# Patient Record
Sex: Female | Born: 1960 | Race: White | Hispanic: No | Marital: Married | Smoking: Never smoker
Health system: Southern US, Community
[De-identification: ages and names within clinical notes are randomized; demographics above are authoritative.]

## PROBLEM LIST (undated history)

## (undated) DIAGNOSIS — F909 Attention-deficit hyperactivity disorder, unspecified type: Secondary | ICD-10-CM

## (undated) DIAGNOSIS — I10 Essential (primary) hypertension: Secondary | ICD-10-CM

## (undated) HISTORY — PX: ABDOMINAL HYSTERECTOMY: SHX81

## (undated) HISTORY — DX: Attention-deficit hyperactivity disorder, unspecified type: F90.9

## (undated) HISTORY — PX: WISDOM TOOTH EXTRACTION: SHX21

## (undated) HISTORY — DX: Essential (primary) hypertension: I10

---

## 2009-04-13 ENCOUNTER — Encounter: Admission: RE | Admit: 2009-04-13 | Discharge: 2009-04-13 | Payer: Self-pay | Admitting: Obstetrics and Gynecology

## 2010-08-06 ENCOUNTER — Ambulatory Visit: Payer: Self-pay | Admitting: Oncology

## 2010-08-19 ENCOUNTER — Ambulatory Visit: Payer: Self-pay | Admitting: Genetic Counselor

## 2010-09-19 ENCOUNTER — Ambulatory Visit: Payer: Self-pay | Admitting: Oncology

## 2010-12-09 ENCOUNTER — Encounter: Payer: Self-pay | Admitting: Obstetrics and Gynecology

## 2011-05-20 ENCOUNTER — Other Ambulatory Visit: Payer: Self-pay | Admitting: Obstetrics and Gynecology

## 2011-05-20 DIAGNOSIS — N6313 Unspecified lump in the right breast, lower outer quadrant: Secondary | ICD-10-CM

## 2011-05-20 DIAGNOSIS — Z8041 Family history of malignant neoplasm of ovary: Secondary | ICD-10-CM

## 2011-05-23 ENCOUNTER — Ambulatory Visit
Admission: RE | Admit: 2011-05-23 | Discharge: 2011-05-23 | Disposition: A | Discharge status: Home / Self Care | Payer: PRIVATE HEALTH INSURANCE | Source: Ambulatory Visit | Attending: Obstetrics and Gynecology | Admitting: Obstetrics and Gynecology

## 2011-05-23 ENCOUNTER — Other Ambulatory Visit: Payer: Self-pay | Admitting: Obstetrics and Gynecology

## 2011-05-23 DIAGNOSIS — Z8041 Family history of malignant neoplasm of ovary: Secondary | ICD-10-CM

## 2011-05-23 DIAGNOSIS — N6313 Unspecified lump in the right breast, lower outer quadrant: Secondary | ICD-10-CM

## 2011-06-11 ENCOUNTER — Other Ambulatory Visit: Payer: Self-pay | Admitting: Internal Medicine

## 2011-06-11 DIAGNOSIS — R1011 Right upper quadrant pain: Secondary | ICD-10-CM

## 2011-06-12 ENCOUNTER — Encounter: Payer: Self-pay | Admitting: Internal Medicine

## 2011-06-17 ENCOUNTER — Ambulatory Visit
Admission: RE | Admit: 2011-06-17 | Discharge: 2011-06-17 | Disposition: A | Discharge status: Home / Self Care | Payer: PRIVATE HEALTH INSURANCE | Source: Ambulatory Visit | Attending: Internal Medicine | Admitting: Internal Medicine

## 2011-06-17 DIAGNOSIS — R1011 Right upper quadrant pain: Secondary | ICD-10-CM

## 2011-06-19 ENCOUNTER — Ambulatory Visit: Payer: PRIVATE HEALTH INSURANCE | Admitting: Gastroenterology

## 2011-08-11 ENCOUNTER — Encounter: Payer: Self-pay | Admitting: Internal Medicine

## 2011-08-11 ENCOUNTER — Ambulatory Visit (INDEPENDENT_AMBULATORY_CARE_PROVIDER_SITE_OTHER): Payer: PRIVATE HEALTH INSURANCE | Admitting: Internal Medicine

## 2011-08-11 VITALS — BP 106/70 | HR 108 | Ht 66.0 in | Wt 135.0 lb

## 2011-08-11 DIAGNOSIS — R1011 Right upper quadrant pain: Secondary | ICD-10-CM

## 2011-08-11 MED ORDER — PEG-KCL-NACL-NASULF-NA ASC-C 100 G PO SOLR: 1.0000 | Freq: Once | ORAL | Status: DC

## 2011-08-11 MED ORDER — HYOSCYAMINE SULFATE 0.125 MG SL SUBL: 0.1250 mg | SUBLINGUAL_TABLET | SUBLINGUAL | Status: DC | PRN

## 2011-08-11 NOTE — Patient Instructions (Addendum)
You have been scheduled for a colonoscopy. Please follow written instructions given to you at your visit today.  Please pick up your Moviprep kit at the pharmacy within the next 2-3 days. We have sent the following medications to your pharmacy for you to pick up at your convenience: levsin SL CC: [name], FNP

## 2011-08-11 NOTE — Progress Notes (Signed)
Angela Kim 1961/06/25 MRN [id number]    History of Present Illness:  This is a 50 year old white female with intermittent right costal margin discomfort which feels like pressure. It occurs during the day and may last several minutes to several hours. It is not associated with meals or position although it never occurs at night. Her bowel habits have been regular. She had a normal upper abdominal ultrasound. Her weight has been stable. She would like to have a colonoscopy based on the guidelines for colorectal screening. The discomfort has not been progressive. There is no position that would relieve it.    Past Medical History  Diagnosis Date  . Hemorrhoids     external   Past Surgical History  Procedure Date  . Wisdom tooth extraction     reports that she has never smoked. She has never used smokeless tobacco. She reports that she drinks alcohol. She reports that she does not use illicit drugs. family history includes Hypertension in her father and Ovarian cancer in her mother. No Known Allergies      Review of Systems: Denies symptoms of reflux, dysphagia, shortness of breath or chest pain The remainder of the 10  point ROS is negative except as outlined in H&P   Physical Exam: General appearance  Well developed, in no distress. Eyes- non icteric. HEENT nontraumatic, normocephalic. Mouth no lesions, tongue papillated, no cheilosis. Neck supple without adenopathy, thyroid not enlarged, no carotid bruits, no JVD. Lungs Clear to auscultation bilaterally. Cor normal S1 normal S2, regular rhythm , no murmur,  quiet precordium. Abdomen soft nontender with normal rib cage no tenderness of the rates. No CVA tenderness. Liver at costal margin. Liver not tender. Left upper quadrant unremarkable. Lower abdomen unremarkable. Rectal: Not done. Extremities no pedal edema. Skin no lesions. Neurological alert and oriented x 3. Psychological normal mood and affect.  Assessment and  Plan:  Problem #1 Persistent intermittent sensation along the right costal margin of unknown etiology. It could be something as simple as hepatic flexure syndrome caused by pressure of the colon on the liver or  possibly on the ribs. She is not overweight but she tends to sit at a computer and that could cause some crowding affect. She is due for a screening colonoscopy and I recommend that she has it to rule out colon pathology. Her symptoms don't seem to be progressive. We have discussed the possibility of a CT scan of the abdomen and pelvis to look for a space-occupying lesion. She is satisfied with just having a colonoscopy. She will take Levsin sublingually 0.125 mg every 4 hours when necessary for abdominal pain to see if that relieves her symptoms.   08/11/2011 Lina Sar

## 2011-08-15 ENCOUNTER — Other Ambulatory Visit: Payer: Self-pay | Admitting: Internal Medicine

## 2011-09-11 ENCOUNTER — Other Ambulatory Visit: Payer: PRIVATE HEALTH INSURANCE | Admitting: Internal Medicine

## 2011-09-12 ENCOUNTER — Other Ambulatory Visit: Payer: Self-pay | Admitting: Internal Medicine

## 2011-09-29 ENCOUNTER — Ambulatory Visit (AMBULATORY_SURGERY_CENTER): Payer: PRIVATE HEALTH INSURANCE | Admitting: Internal Medicine

## 2011-09-29 ENCOUNTER — Encounter: Payer: Self-pay | Admitting: Internal Medicine

## 2011-09-29 DIAGNOSIS — Z1211 Encounter for screening for malignant neoplasm of colon: Secondary | ICD-10-CM

## 2011-09-29 DIAGNOSIS — R1011 Right upper quadrant pain: Secondary | ICD-10-CM

## 2011-09-29 MED ORDER — SODIUM CHLORIDE 0.9 % IV SOLN: 500.0000 mL | INTRAVENOUS | Status: DC

## 2011-09-29 NOTE — Progress Notes (Signed)
Dr. Juanda Chance informed of LMP=07/30/2011. No orders received . Pt felt like irreg. periods x one yr. due to premenopause. Pt still on birthcontrol pills which she takes regularly, took last yesterday.

## 2011-09-30 ENCOUNTER — Telehealth: Payer: Self-pay | Admitting: *Deleted

## 2011-09-30 NOTE — Telephone Encounter (Signed)
Number ID on answering machine. Left message to call us back if she is experiencing any problems or has any questions.

## 2013-02-16 IMAGING — MG MM DIAGNOSTIC BILATERAL
5 series · 5 of 5 positions shown · non-contrast
Comparison: Prior exams

CLINICAL DATA: Questioned palpable finding per patient right
breast nine o'clock location

DIGITAL DIAGNOSTIC BILATERAL MAMMOGRAM WITH CAD AND RIGHT BREAST
ULTRASOUND:

[R CC]
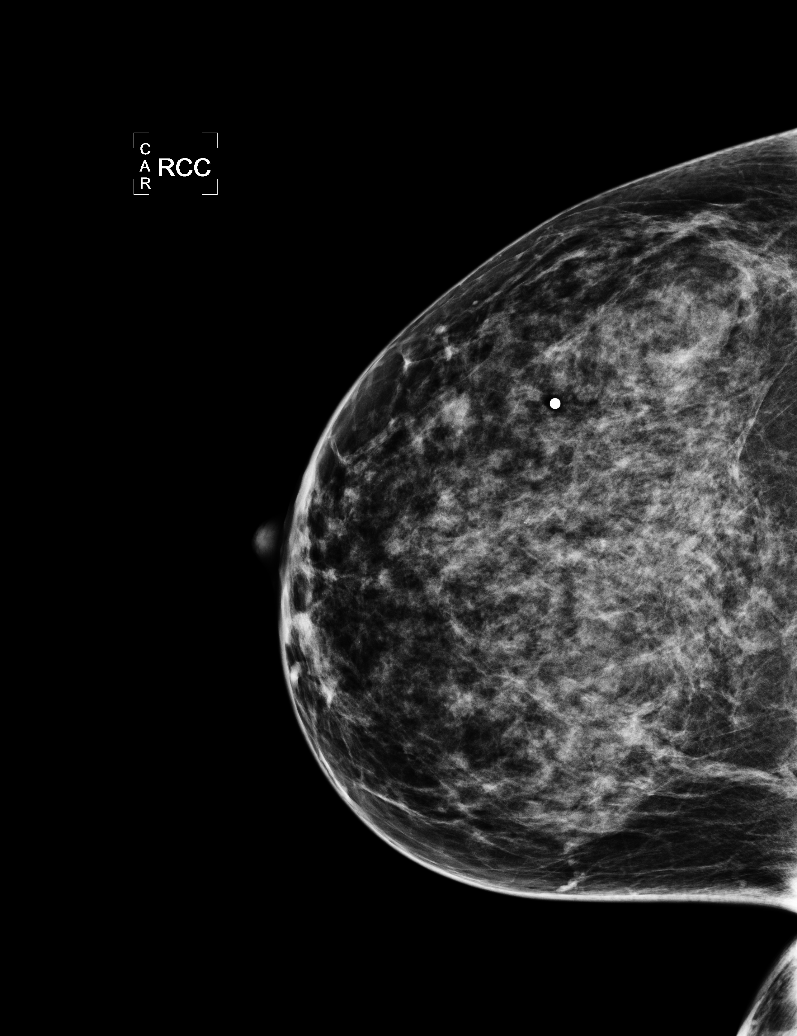

[L CC]
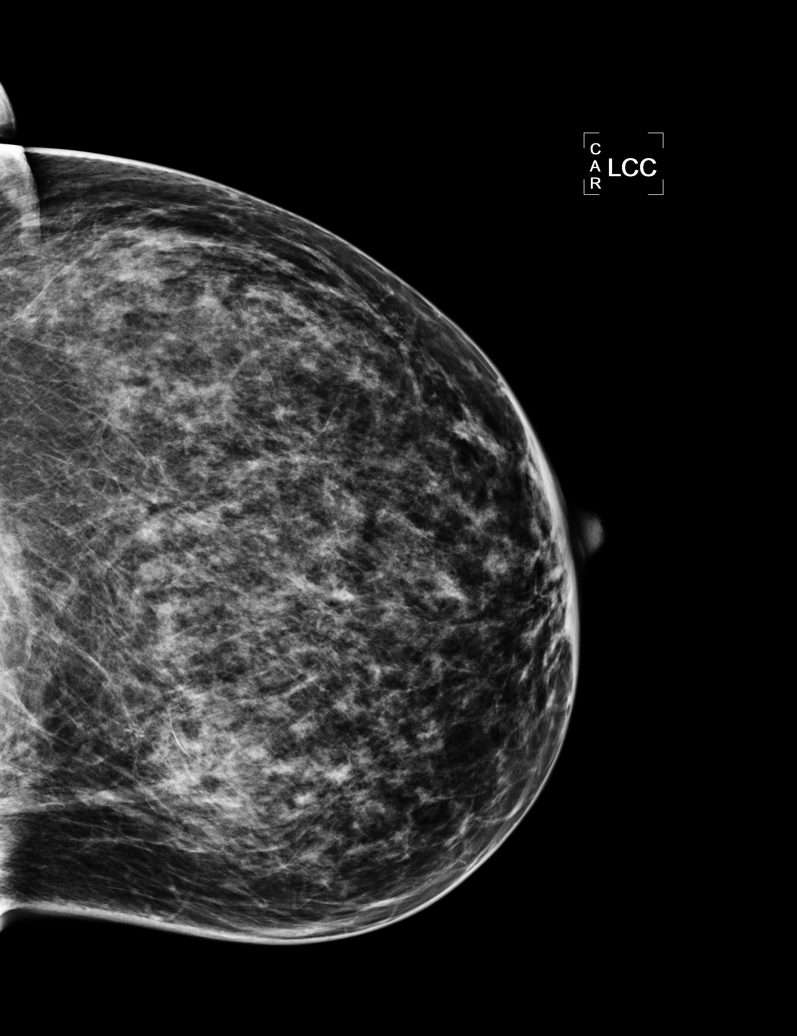

[L MLO]
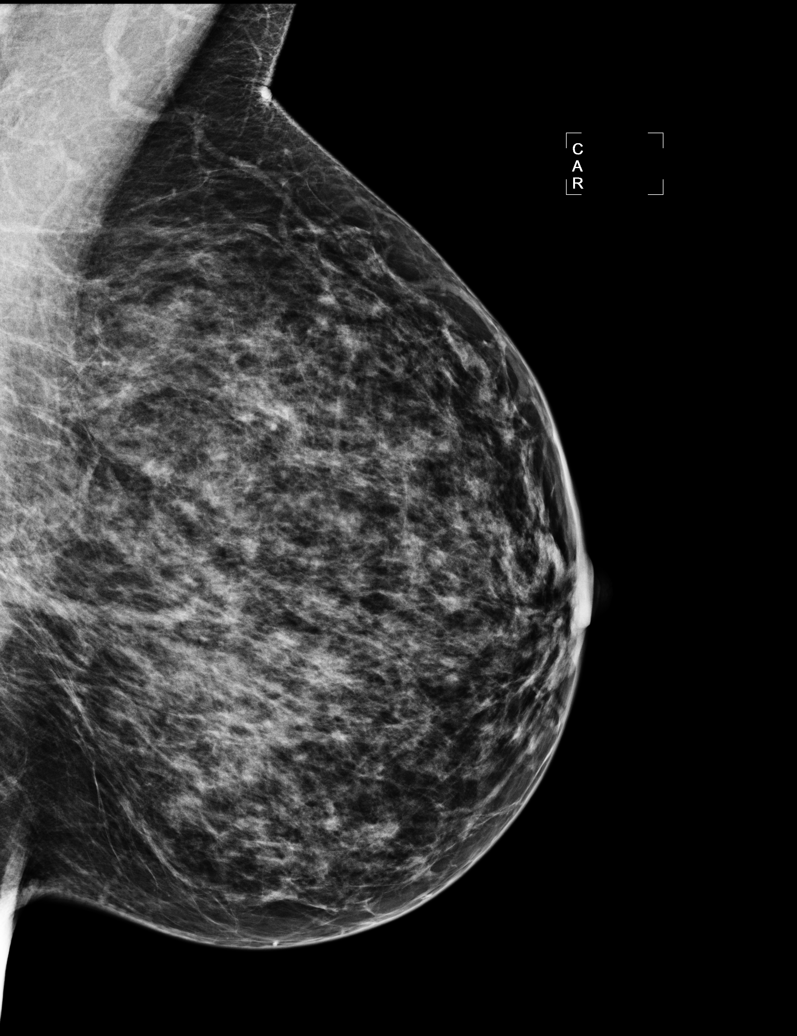

[R MLO]
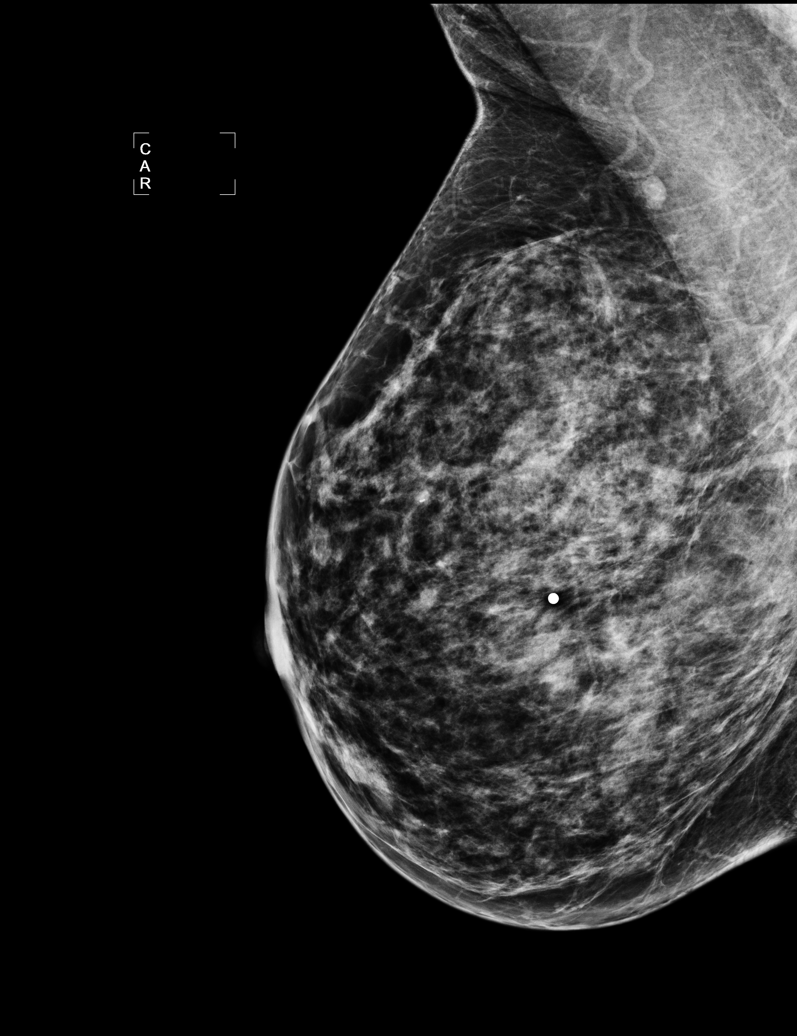

[R TAN]
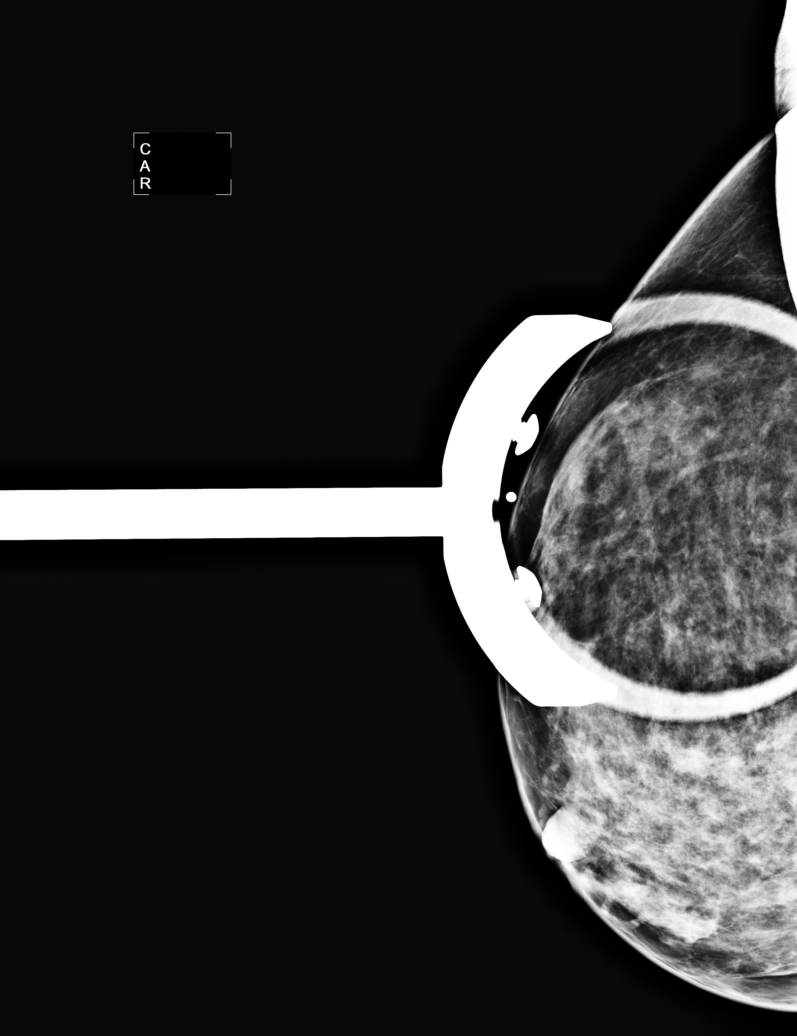

[5 of 5 positions shown; findings below may reference images not displayed]

FINDINGS: The breast parenchyma is extremely dense, which may
limit the sensitivity of mammography.  No suspicious mass,
calcification, or architectural distortion is seen on either side.
There is no change.
Mammographic images were processed with CAD.

On physical exam, I palpate dense tissue in the right breast nine
o'clock location but no focal abnormality.

Ultrasound is performed, showing normal-appearing fibroglandular
tissue in the right breast nine o'clock area at the site of the
patient indicated questioned palpable finding.
IMPRESSION: No evidence for malignancy in either breast.  Any further
management of the questioned palpable finding should be dictated by
clinical assessment.  We extensively discussed options for further
follow-up.  I emphasized the need for continued breast self exams
to assess for lack of resolution or any other change.  Ultimately,
surgical excision may be necessary if the patient desires to
definitively exclude malignancy.  We decided to pursue 6-month
follow-up right diagnostic mammography unless there is any clinical
change, in which case repeat mammography/ultrasound could be
performed, or MRI may be an option for further evaluation.

BI-RADS CATEGORY 3:  Probably benign finding(s) - short interval
follow-up suggested.

Recommendation:  Right diagnostic mammogram and possibly ultrasound
in 6 months.

## 2014-07-15 ENCOUNTER — Emergency Department (HOSPITAL_COMMUNITY)
Admission: EM | Admit: 2014-07-15 | Discharge: 2014-07-15 | Disposition: A | Discharge status: Home / Self Care | Payer: BC Managed Care – PPO | Attending: Emergency Medicine | Admitting: Emergency Medicine

## 2014-07-15 ENCOUNTER — Encounter (HOSPITAL_COMMUNITY): Payer: Self-pay | Admitting: Emergency Medicine

## 2014-07-15 DIAGNOSIS — M545 Low back pain, unspecified: Secondary | ICD-10-CM | POA: Diagnosis present

## 2014-07-15 DIAGNOSIS — Z79899 Other long term (current) drug therapy: Secondary | ICD-10-CM | POA: Insufficient documentation

## 2014-07-15 DIAGNOSIS — Z87828 Personal history of other (healed) physical injury and trauma: Secondary | ICD-10-CM | POA: Insufficient documentation

## 2014-07-15 LAB — URINALYSIS, ROUTINE W REFLEX MICROSCOPIC
BILIRUBIN URINE: NEGATIVE
Glucose, UA: NEGATIVE mg/dL
Hgb urine dipstick: NEGATIVE
Ketones, ur: NEGATIVE mg/dL
Leukocytes, UA: NEGATIVE
NITRITE: NEGATIVE
PROTEIN: NEGATIVE mg/dL
SPECIFIC GRAVITY, URINE: 1.029 (ref 1.005–1.030)
UROBILINOGEN UA: 1 mg/dL (ref 0.0–1.0)
pH: 7 (ref 5.0–8.0)

## 2014-07-15 MED ORDER — HYDROCODONE-ACETAMINOPHEN 5-325 MG PO TABS
2.0000 | ORAL_TABLET | Freq: Once | ORAL | Status: AC
  Administered 2014-07-15: 2 via ORAL
  Filled 2014-07-15: qty 2

## 2014-07-15 MED ORDER — HYDROCODONE-ACETAMINOPHEN 5-325 MG PO TABS: 1.0000 | ORAL_TABLET | Freq: Four times a day (QID) | ORAL | Status: DC | PRN

## 2014-07-15 NOTE — Discharge Instructions (Signed)
Take Tylenol or Advil for mild pain or the pain medicine prescribed for bad pain. Get your blood pressure recheck within the next 3 weeks. Today's is mildly elevated at 150/93. Call Dr. Carolee Rota office to arrange to be seen in a week if not feeling better

## 2014-07-15 NOTE — ED Notes (Signed)
Pt from home c/o R side and mid back pain. Pt reports the flank pain has been for 3years and mid back pain has been for 3 weeks. Pt has been taking ibuprofen w/o relief. Pt denies urinary s/sx. Pt denies injury, but reports that she helped her boys move to college and was lifting heavy items. Pt is A&O and in NAD

## 2014-07-15 NOTE — ED Notes (Signed)
Spoke with MD Jacubowitz-patient requests ultrasound or CT scan. MD states "I don't see the need for any further treatment on her. I've already spoken with her." Family aware.

## 2014-07-15 NOTE — ED Provider Notes (Addendum)
CSN: [id number]     Arrival date & time 07/15/14  1040 History   First MD Initiated Contact with Patient 07/15/14 1123     Chief Complaint  Patient presents with  . Back Pain  . Flank Pain     (Consider location/radiation/quality/duration/timing/severity/associated sxs/prior Treatment) HPI Presents with right-sided low back pain, parathoracic area, paralumbar area onset 1 month ago. Pain is worse with certain positions improved with other positions. No shortness of breath. Pain is not affected by ED in. No urinary symptoms no fever . She is treated herself with ibuprofen without relief. No rash. Pain is sharp, progressively worsening. Worsen she helped her son's move several days ago to Past Medical History  Diagnosis Date  . Hemorrhoids     external   Past Surgical History  Procedure Laterality Date  . Wisdom tooth extraction    . Abdominal hysterectomy     Family History  Problem Relation Age of Onset  . Ovarian cancer Mother   . Hypertension Father    History  Substance Use Topics  . Smoking status: Never Smoker   . Smokeless tobacco: Never Used  . Alcohol Use: 1.2 oz/week    2 Glasses of wine per week     Comment: socially 1-2 a week   OB History   Grav Para Term Preterm Abortions TAB SAB Ect Mult Living                 Review of Systems  Musculoskeletal: Positive for back pain.  All other systems reviewed and are negative.     Allergies  Review of patient's allergies indicates no known allergies.  Home Medications   Prior to Admission medications   Medication Sig Start Date End Date Taking? Authorizing Provider  AMBULATORY NON FORMULARY MEDICATION Antibiotic for acne     Historical Provider, MD  amphetamine-dextroamphetamine (ADDERALL) 30 MG tablet Take 30 mg by mouth daily.      Historical Provider, MD  hyoscyamine (LEVSIN/SL) 0.125 MG SL tablet Place 1 tablet (0.125 mg total) under the tongue every 4 (four) hours as needed for cramping. 08/11/11 08/21/11   Hart Carwin, MD  MINIVELLE 0.075 MG/24HR  07/04/14   Historical Provider, MD  norethindrone-ethinyl estradiol (JUNEL FE,GILDESS FE,LOESTRIN FE) 1-20 MG-MCG tablet Take 1 tablet by mouth daily.      Historical Provider, MD   BP 144/95  Pulse 104  Temp(Src) 98.1 F (36.7 C)  Resp 16  SpO2 96%  LMP 07/28/2011 Physical Exam  Nursing note and vitals reviewed. Constitutional: She appears well-developed and well-nourished.  HENT:  Head: Normocephalic and atraumatic.  Eyes: Conjunctivae are normal. Pupils are equal, round, and reactive to light.  Neck: Neck supple. No tracheal deviation present. No thyromegaly present.  Cardiovascular: Normal rate and regular rhythm.   No murmur heard. Pulmonary/Chest: Effort normal and breath sounds normal.  Abdominal: Soft. Bowel sounds are normal. She exhibits no distension. There is no tenderness.  Musculoskeletal: Normal range of motion. She exhibits no edema and no tenderness.  Entire spine nontender. Right-sided peritonsillar thoracic and paralumbar tenderness  Neurological: She is alert. Coordination normal.  Gait normal  Skin: Skin is warm and dry. No rash noted.  Psychiatric: She has a normal mood and affect.    ED Course  Procedures (including critical care time) Labs Review Labs Reviewed - No data to display  Imaging Review No results found.   EKG Interpretation None      Results for orders placed during the hospital encounter  of 07/15/14  URINALYSIS, ROUTINE W REFLEX MICROSCOPIC      Result Value Ref Range   Color, Urine YELLOW  YELLOW   APPearance CLOUDY (*) CLEAR   Specific Gravity, Urine 1.029  1.005 - 1.030   pH 7.0  5.0 - 8.0   Glucose, UA NEGATIVE  NEGATIVE mg/dL   Hgb urine dipstick NEGATIVE  NEGATIVE   Bilirubin Urine NEGATIVE  NEGATIVE   Ketones, ur NEGATIVE  NEGATIVE mg/dL   Protein, ur NEGATIVE  NEGATIVE mg/dL   Urobilinogen, UA 1.0  0.0 - 1.0 mg/dL   Nitrite NEGATIVE  NEGATIVE   Leukocytes, UA NEGATIVE   NEGATIVE   No results found.  1 PM pain somewhat improved after treatment with Norco. MDM  I feel the pain is most likely musculoskeletal in etiology. I don't feel acute imaging is needed. I strongly doubt gallbladder disease pain is not at all related to food. She has no abdominal tenderness. Plan prescription Norco. Blood pressure recheck within 3 weeks. Follow up with Dr.Nobles's office if not improved in a week Final diagnoses:  None   diagnosis #1 back pain #2 elevated blood pressure      Doug Sou, MD 07/15/14 1309  Doug Sou, MD 07/15/14 1310

## 2017-03-09 ENCOUNTER — Other Ambulatory Visit: Payer: Self-pay | Admitting: Obstetrics and Gynecology

## 2017-03-09 DIAGNOSIS — R928 Other abnormal and inconclusive findings on diagnostic imaging of breast: Secondary | ICD-10-CM

## 2017-03-10 ENCOUNTER — Ambulatory Visit
Admission: RE | Admit: 2017-03-10 | Discharge: 2017-03-10 | Disposition: A | Discharge status: Home / Self Care | Payer: Managed Care, Other (non HMO) | Source: Ambulatory Visit | Attending: Obstetrics and Gynecology | Admitting: Obstetrics and Gynecology

## 2017-03-10 DIAGNOSIS — R928 Other abnormal and inconclusive findings on diagnostic imaging of breast: Secondary | ICD-10-CM

## 2017-03-11 ENCOUNTER — Other Ambulatory Visit: Payer: PRIVATE HEALTH INSURANCE

## 2017-07-02 ENCOUNTER — Telehealth: Payer: Self-pay | Admitting: Cardiology

## 2017-07-02 NOTE — Telephone Encounter (Signed)
[facility] on 07/28/17 with Dr Herbie BaltimoreHarding.  Records put with Dr Elissa HeftyHarding's schedule for 07/28/17. lp

## 2017-07-28 ENCOUNTER — Encounter: Payer: Self-pay | Admitting: Cardiology

## 2017-07-28 ENCOUNTER — Ambulatory Visit (INDEPENDENT_AMBULATORY_CARE_PROVIDER_SITE_OTHER): Payer: Managed Care, Other (non HMO) | Admitting: Cardiology

## 2017-07-28 DIAGNOSIS — R9431 Abnormal electrocardiogram [ECG] [EKG]: Secondary | ICD-10-CM | POA: Diagnosis not present

## 2017-07-28 NOTE — Patient Instructions (Addendum)
NO CHANGE WITH CURRENT TREATMENT.    Your physician recommends that you schedule a follow-up appointment ON AN AS NEEDED BASIS.  

## 2017-07-28 NOTE — Progress Notes (Signed)
PCP: Merri Brunette, MD  Clinic Note: Chief Complaint  Patient presents with  . New Patient (Initial Visit)    abnormal EKG    HPI: Angela Kim is a 56 y.o. female who is being seen today for the evaluation of Abnormal EKG at the request of Merri Brunette, MD & associates.  Angela Kim was last seen on August 14 by Dr. Carolee Rota PAs (she is Dr. Carolee Rota Sister-in-law).  She was actually doing very well with no major complaints. He cut she noted some occasional lightheadedness and dizziness as well as some occasional fluttering in her chest, she had an EKG checked. EKG was read as having poor a progression probable non-specific. As result she's now referred for cardiology evaluation based on these EKG.  Recent Hospitalizations: none  Studies Personally Reviewed - (if available, images/films reviewed: From Epic Chart or Care Everywhere)  PCP EKG: 11/14/2014: Sinus rhythm, rate 79 BPM. Poor R-wave progression.Likely normal variant  EKG from August 14: Sinus rhythm, rate 87 BPM. Again poor R-wave progression noted. Nonspecific ST and T-wave changes. - Has appearance of abnormal lead placement.  Interval History: Angela Kim presents today a little bit unsure as to why she is here. She does note occasional fluttering in her chest but not associated with dizziness or syncope/near syncope. She is under quite a bit of social stress --  planning her son's wedding.  ith the stress she occasionally feels some tightness in her chest every now and then but is not associated with any particular activity She is really describes as more of a choking sensation.  She does note occasional lightheadedness and dizziness and has been told to reduce her blood pressure half. Since doing so that's been better.  She was actually put on a diuretic as her blood pressure control medicine because of occasional leg swelling.   No chest pain or shortness of breath with rest or exertion.  No PND, orthopnea with well  controlled edema. Occasional "chest fluttering" - spells last only a few seconds.   No TIA/amaurosis fugax symptoms.  No claudication.  ROS: A comprehensive was performed. Review of Systems  Constitutional: Negative for malaise/fatigue.  HENT: Negative for nosebleeds.   Respiratory: Negative for cough, shortness of breath and wheezing.   Cardiovascular:       Per history of present illness  Gastrointestinal: Negative for abdominal pain, blood in stool, heartburn and melena.  Genitourinary: Negative for hematuria.  Musculoskeletal: Negative.   Neurological: Negative.   Endo/Heme/Allergies: Negative.   Psychiatric/Behavioral:       She does have social stress. But no anxiety or depression symptoms  All other systems reviewed and are negative.  I have reviewed and (if needed) personally updated the patient's problem list, medications, allergies, past medical and surgical history, social and family history.   Past Medical History:  Diagnosis Date  . ADHD   . Essential hypertension   . Hemorrhoids    external    Past Surgical History:  Procedure Laterality Date  . ABDOMINAL HYSTERECTOMY    . WISDOM TOOTH EXTRACTION      Current Meds  Medication Sig  . lisdexamfetamine (VYVANSE) 70 MG capsule Take 1 tablet by mouth daily.  Marland Kitchen MINIVELLE 0.075 MG/24HR Place 1 patch onto the skin 2 (two) times a week.   . triamterene-hydrochlorothiazide (DYAZIDE) 37.5-25 MG capsule Take by mouth daily. Takes 1/2 tablet  . zolpidem (AMBIEN) 5 MG tablet Take 5 mg by mouth at bedtime as needed for sleep.    No  Known Allergies  Social History   Social History  . Marital status: Married    Spouse name: N/A  . Number of children: 4  . Years of education: N/A   Occupational History  . authur/ speaker    Social History Main Topics  . Smoking status: Never Smoker  . Smokeless tobacco: Never Used  . Alcohol use 1.2 oz/week    2 Glasses of wine per week     Comment: socially 1-2 a week  .  Drug use: No  . Sexual activity: Yes   Other Topics Concern  . None   Social History Narrative   She indicates that she does not actually exercising regularly, but does stay active.    family history includes Atrial fibrillation in her father; Ovarian cancer in her mother.  Wt Readings from Last 3 Encounters:  07/28/17 151 lb 3.2 oz (68.6 kg)  09/29/11 135 lb (61.2 kg)  08/11/11 135 lb (61.2 kg)    PHYSICAL EXAM BP 140/86   Pulse (!) 105   Ht 5\' 6"  (1.676 m)   Wt 151 lb 3.2 oz (68.6 kg)   LMP 07/28/2011   BMI 24.40 kg/m  Physical Exam  Constitutional: She is oriented to person, place, and time. She appears well-developed and well-nourished. No distress.  Healthy-appearing. Well-groomed  HENT:  Head: Normocephalic and atraumatic.  Eyes: Pupils are equal, round, and reactive to light. EOM are normal. No scleral icterus.  Neck: Normal range of motion. Neck supple. No hepatojugular reflux and no JVD present. Carotid bruit is not present.  Cardiovascular: Normal rate, regular rhythm, normal heart sounds and intact distal pulses.   No extrasystoles are present. PMI is not displaced.  Exam reveals no gallop and no friction rub.   No murmur heard. Pulmonary/Chest: Effort normal and breath sounds normal. No respiratory distress. She has no wheezes. She has no rales.  Abdominal: Soft. Bowel sounds are normal. She exhibits no distension. There is no tenderness. There is no rebound and no guarding.  Musculoskeletal: Normal range of motion. She exhibits no edema or deformity.  Lymphadenopathy:    She has no cervical adenopathy.  Neurological: She is alert and oriented to person, place, and time. No cranial nerve deficit.  Skin: Skin is warm and dry. No rash noted. No erythema.  Psychiatric: She has a normal mood and affect. Her behavior is normal. Thought content normal.     Adult ECG Report  Rate: 105 ;  Rhythm: sinus tachycardia and ~ left atrial abnormality. Otherwise normal axis,  intervals and durations.;   Narrative Interpretation: relatively normal EKG.  Other studies Reviewed: Additional studies/ records that were reviewed today include:  Recent Labs:  n/a   ASSESSMENT / PLAN: Problem List Items Addressed This Visit    Abnormal EKG    I reviewed the EKGs from her PCPs office as well as her EKG here today. She has very mild nonspecific changes. Poor r-wave progression is a very nonspecific finding,  and in the absence of any other risk factors would probably not be something that would further evaluate.  He has no angina type symptoms, no heart failure type symptoms and therefore, I don't think evaluation with either a stress test or echocardiogram at this time would be warranted.  Worsening to have symptoms concerning for heart failure, angina or significant palpitations, would then consider echocardiogram plus or minus monitor or GXT.      Relevant Orders   EKG 12-Lead      Current  medicines are reviewed at length with the patient today. (+/- concerns) n/a The following changes have been made: n/a  Patient Instructions  NO CHANGE  WITH CURRENT TREATMENT   Your physician recommends that you schedule a follow-up appointment ON AN AS NEEDED BASIS.    Marland Kitchen   Studies Ordered:   Orders Placed This Encounter  Procedures  . EKG 12-Lead      Bryan Lemma, M.D., M.S. Interventional Cardiologist   Pager # [phone] Phone # [phone] [address] [place], [place] [address]

## 2017-08-04 ENCOUNTER — Encounter: Payer: Self-pay | Admitting: Cardiology

## 2017-08-04 DIAGNOSIS — R9431 Abnormal electrocardiogram [ECG] [EKG]: Secondary | ICD-10-CM | POA: Insufficient documentation

## 2017-08-04 NOTE — Assessment & Plan Note (Signed)
I reviewed the EKGs from her PCPs office as well as her EKG here today. She has very mild nonspecific changes. Poor r-wave progression is a very nonspecific finding,  and in the absence of any other risk factors would probably not be something that would further evaluate.  He has no angina type symptoms, no heart failure type symptoms and therefore, I don't think evaluation with either a stress test or echocardiogram at this time would be warranted.  Worsening to have symptoms concerning for heart failure, angina or significant palpitations, would then consider echocardiogram plus or minus monitor or GXT.

## 2019-02-01 DIAGNOSIS — F900 Attention-deficit hyperactivity disorder, predominantly inattentive type: Secondary | ICD-10-CM | POA: Diagnosis not present

## 2019-05-03 DIAGNOSIS — F900 Attention-deficit hyperactivity disorder, predominantly inattentive type: Secondary | ICD-10-CM | POA: Diagnosis not present

## 2019-05-17 DIAGNOSIS — Z6826 Body mass index (BMI) 26.0-26.9, adult: Secondary | ICD-10-CM | POA: Diagnosis not present

## 2019-05-17 DIAGNOSIS — Z01419 Encounter for gynecological examination (general) (routine) without abnormal findings: Secondary | ICD-10-CM | POA: Diagnosis not present

## 2019-05-17 DIAGNOSIS — Z1231 Encounter for screening mammogram for malignant neoplasm of breast: Secondary | ICD-10-CM | POA: Diagnosis not present

## 2019-07-14 DIAGNOSIS — F900 Attention-deficit hyperactivity disorder, predominantly inattentive type: Secondary | ICD-10-CM | POA: Diagnosis not present

## 2019-08-09 DIAGNOSIS — E559 Vitamin D deficiency, unspecified: Secondary | ICD-10-CM | POA: Diagnosis not present

## 2019-08-09 DIAGNOSIS — I10 Essential (primary) hypertension: Secondary | ICD-10-CM | POA: Diagnosis not present

## 2019-08-09 DIAGNOSIS — E7800 Pure hypercholesterolemia, unspecified: Secondary | ICD-10-CM | POA: Diagnosis not present

## 2019-08-09 DIAGNOSIS — E538 Deficiency of other specified B group vitamins: Secondary | ICD-10-CM | POA: Diagnosis not present

## 2019-08-16 DIAGNOSIS — Z0000 Encounter for general adult medical examination without abnormal findings: Secondary | ICD-10-CM | POA: Diagnosis not present

## 2019-08-16 DIAGNOSIS — Z23 Encounter for immunization: Secondary | ICD-10-CM | POA: Diagnosis not present

## 2019-08-29 DIAGNOSIS — Z20828 Contact with and (suspected) exposure to other viral communicable diseases: Secondary | ICD-10-CM | POA: Diagnosis not present

## 2019-10-05 DIAGNOSIS — F900 Attention-deficit hyperactivity disorder, predominantly inattentive type: Secondary | ICD-10-CM | POA: Diagnosis not present

## 2019-10-31 DIAGNOSIS — H903 Sensorineural hearing loss, bilateral: Secondary | ICD-10-CM | POA: Diagnosis not present

## 2019-11-23 DIAGNOSIS — Z20828 Contact with and (suspected) exposure to other viral communicable diseases: Secondary | ICD-10-CM | POA: Diagnosis not present

## 2020-01-03 DIAGNOSIS — F900 Attention-deficit hyperactivity disorder, predominantly inattentive type: Secondary | ICD-10-CM | POA: Diagnosis not present

## 2020-03-16 DIAGNOSIS — M25561 Pain in right knee: Secondary | ICD-10-CM | POA: Diagnosis not present

## 2020-03-29 DIAGNOSIS — M25561 Pain in right knee: Secondary | ICD-10-CM | POA: Diagnosis not present

## 2020-03-29 DIAGNOSIS — M238X1 Other internal derangements of right knee: Secondary | ICD-10-CM | POA: Diagnosis not present

## 2020-04-10 DIAGNOSIS — F900 Attention-deficit hyperactivity disorder, predominantly inattentive type: Secondary | ICD-10-CM | POA: Diagnosis not present

## 2020-05-24 DIAGNOSIS — Z01419 Encounter for gynecological examination (general) (routine) without abnormal findings: Secondary | ICD-10-CM | POA: Diagnosis not present

## 2020-05-24 DIAGNOSIS — Z6826 Body mass index (BMI) 26.0-26.9, adult: Secondary | ICD-10-CM | POA: Diagnosis not present

## 2020-05-24 DIAGNOSIS — Z1231 Encounter for screening mammogram for malignant neoplasm of breast: Secondary | ICD-10-CM | POA: Diagnosis not present

## 2020-07-02 DIAGNOSIS — Z20828 Contact with and (suspected) exposure to other viral communicable diseases: Secondary | ICD-10-CM | POA: Diagnosis not present

## 2020-07-10 DIAGNOSIS — F900 Attention-deficit hyperactivity disorder, predominantly inattentive type: Secondary | ICD-10-CM | POA: Diagnosis not present

## 2020-08-21 DIAGNOSIS — Z0000 Encounter for general adult medical examination without abnormal findings: Secondary | ICD-10-CM | POA: Diagnosis not present

## 2020-08-21 DIAGNOSIS — M1990 Unspecified osteoarthritis, unspecified site: Secondary | ICD-10-CM | POA: Diagnosis not present

## 2020-08-21 DIAGNOSIS — E559 Vitamin D deficiency, unspecified: Secondary | ICD-10-CM | POA: Diagnosis not present

## 2020-08-21 DIAGNOSIS — E538 Deficiency of other specified B group vitamins: Secondary | ICD-10-CM | POA: Diagnosis not present

## 2020-08-21 DIAGNOSIS — I10 Essential (primary) hypertension: Secondary | ICD-10-CM | POA: Diagnosis not present

## 2020-08-28 DIAGNOSIS — Z0000 Encounter for general adult medical examination without abnormal findings: Secondary | ICD-10-CM | POA: Diagnosis not present

## 2020-08-28 DIAGNOSIS — Z23 Encounter for immunization: Secondary | ICD-10-CM | POA: Diagnosis not present

## 2020-10-09 DIAGNOSIS — F900 Attention-deficit hyperactivity disorder, predominantly inattentive type: Secondary | ICD-10-CM | POA: Diagnosis not present

## 2020-11-11 DIAGNOSIS — Z20828 Contact with and (suspected) exposure to other viral communicable diseases: Secondary | ICD-10-CM | POA: Diagnosis not present

## 2021-01-08 DIAGNOSIS — F900 Attention-deficit hyperactivity disorder, predominantly inattentive type: Secondary | ICD-10-CM | POA: Diagnosis not present

## 2021-01-24 DIAGNOSIS — Z20822 Contact with and (suspected) exposure to covid-19: Secondary | ICD-10-CM | POA: Diagnosis not present

## 2021-01-25 DIAGNOSIS — Z1159 Encounter for screening for other viral diseases: Secondary | ICD-10-CM | POA: Diagnosis not present

## 2021-03-20 DIAGNOSIS — F900 Attention-deficit hyperactivity disorder, predominantly inattentive type: Secondary | ICD-10-CM | POA: Diagnosis not present

## 2021-06-10 DIAGNOSIS — Z1231 Encounter for screening mammogram for malignant neoplasm of breast: Secondary | ICD-10-CM | POA: Diagnosis not present

## 2021-06-10 DIAGNOSIS — Z01419 Encounter for gynecological examination (general) (routine) without abnormal findings: Secondary | ICD-10-CM | POA: Diagnosis not present

## 2021-06-10 DIAGNOSIS — Z6826 Body mass index (BMI) 26.0-26.9, adult: Secondary | ICD-10-CM | POA: Diagnosis not present

## 2021-06-19 DIAGNOSIS — F900 Attention-deficit hyperactivity disorder, predominantly inattentive type: Secondary | ICD-10-CM | POA: Diagnosis not present

## 2021-09-18 DIAGNOSIS — F900 Attention-deficit hyperactivity disorder, predominantly inattentive type: Secondary | ICD-10-CM | POA: Diagnosis not present

## 2021-11-30 ENCOUNTER — Encounter: Payer: Self-pay | Admitting: Gastroenterology

## 2022-01-09 DIAGNOSIS — F900 Attention-deficit hyperactivity disorder, predominantly inattentive type: Secondary | ICD-10-CM | POA: Diagnosis not present

## 2022-04-10 DIAGNOSIS — F900 Attention-deficit hyperactivity disorder, predominantly inattentive type: Secondary | ICD-10-CM | POA: Diagnosis not present

## 2022-07-10 DIAGNOSIS — F900 Attention-deficit hyperactivity disorder, predominantly inattentive type: Secondary | ICD-10-CM | POA: Diagnosis not present

## 2022-08-11 DIAGNOSIS — E538 Deficiency of other specified B group vitamins: Secondary | ICD-10-CM | POA: Diagnosis not present

## 2022-08-11 DIAGNOSIS — E559 Vitamin D deficiency, unspecified: Secondary | ICD-10-CM | POA: Diagnosis not present

## 2022-08-11 DIAGNOSIS — E8941 Symptomatic postprocedural ovarian failure: Secondary | ICD-10-CM | POA: Diagnosis not present

## 2022-08-11 DIAGNOSIS — R5383 Other fatigue: Secondary | ICD-10-CM | POA: Diagnosis not present

## 2022-08-11 DIAGNOSIS — Z1329 Encounter for screening for other suspected endocrine disorder: Secondary | ICD-10-CM | POA: Diagnosis not present

## 2022-08-11 DIAGNOSIS — I10 Essential (primary) hypertension: Secondary | ICD-10-CM | POA: Diagnosis not present

## 2022-08-11 DIAGNOSIS — Z131 Encounter for screening for diabetes mellitus: Secondary | ICD-10-CM | POA: Diagnosis not present

## 2022-09-01 DIAGNOSIS — Z1231 Encounter for screening mammogram for malignant neoplasm of breast: Secondary | ICD-10-CM | POA: Diagnosis not present

## 2022-09-01 DIAGNOSIS — Z01419 Encounter for gynecological examination (general) (routine) without abnormal findings: Secondary | ICD-10-CM | POA: Diagnosis not present

## 2022-09-01 DIAGNOSIS — Z6827 Body mass index (BMI) 27.0-27.9, adult: Secondary | ICD-10-CM | POA: Diagnosis not present

## 2022-09-08 DIAGNOSIS — N951 Menopausal and female climacteric states: Secondary | ICD-10-CM | POA: Diagnosis not present

## 2022-09-08 DIAGNOSIS — R5383 Other fatigue: Secondary | ICD-10-CM | POA: Diagnosis not present

## 2022-10-01 DIAGNOSIS — F900 Attention-deficit hyperactivity disorder, predominantly inattentive type: Secondary | ICD-10-CM | POA: Diagnosis not present

## 2022-12-24 DIAGNOSIS — F900 Attention-deficit hyperactivity disorder, predominantly inattentive type: Secondary | ICD-10-CM | POA: Diagnosis not present

## 2023-01-27 DIAGNOSIS — R5383 Other fatigue: Secondary | ICD-10-CM | POA: Diagnosis not present

## 2023-01-27 DIAGNOSIS — N951 Menopausal and female climacteric states: Secondary | ICD-10-CM | POA: Diagnosis not present

## 2023-01-27 DIAGNOSIS — R7982 Elevated C-reactive protein (CRP): Secondary | ICD-10-CM | POA: Diagnosis not present

## 2023-01-27 DIAGNOSIS — E559 Vitamin D deficiency, unspecified: Secondary | ICD-10-CM | POA: Diagnosis not present

## 2023-01-27 DIAGNOSIS — E8941 Symptomatic postprocedural ovarian failure: Secondary | ICD-10-CM | POA: Diagnosis not present

## 2023-01-27 DIAGNOSIS — Z1329 Encounter for screening for other suspected endocrine disorder: Secondary | ICD-10-CM | POA: Diagnosis not present

## 2023-02-10 DIAGNOSIS — I10 Essential (primary) hypertension: Secondary | ICD-10-CM | POA: Diagnosis not present

## 2023-02-10 DIAGNOSIS — N951 Menopausal and female climacteric states: Secondary | ICD-10-CM | POA: Diagnosis not present

## 2023-02-10 DIAGNOSIS — R7982 Elevated C-reactive protein (CRP): Secondary | ICD-10-CM | POA: Diagnosis not present

## 2023-02-10 DIAGNOSIS — R5383 Other fatigue: Secondary | ICD-10-CM | POA: Diagnosis not present

## 2023-03-12 DIAGNOSIS — F900 Attention-deficit hyperactivity disorder, predominantly inattentive type: Secondary | ICD-10-CM | POA: Diagnosis not present

## 2023-06-11 DIAGNOSIS — F900 Attention-deficit hyperactivity disorder, predominantly inattentive type: Secondary | ICD-10-CM | POA: Diagnosis not present

## 2023-07-21 DIAGNOSIS — L710 Perioral dermatitis: Secondary | ICD-10-CM | POA: Diagnosis not present

## 2023-07-21 DIAGNOSIS — R5381 Other malaise: Secondary | ICD-10-CM | POA: Diagnosis not present

## 2023-07-21 DIAGNOSIS — R7982 Elevated C-reactive protein (CRP): Secondary | ICD-10-CM | POA: Diagnosis not present

## 2023-07-21 DIAGNOSIS — R5383 Other fatigue: Secondary | ICD-10-CM | POA: Diagnosis not present

## 2023-07-22 ENCOUNTER — Other Ambulatory Visit (HOSPITAL_COMMUNITY): Payer: Self-pay | Admitting: Registered Nurse

## 2023-07-22 DIAGNOSIS — R7982 Elevated C-reactive protein (CRP): Secondary | ICD-10-CM

## 2023-07-30 ENCOUNTER — Ambulatory Visit (HOSPITAL_COMMUNITY)
Admission: RE | Admit: 2023-07-30 | Discharge: 2023-07-30 | Disposition: A | Discharge status: Home / Self Care | Payer: Self-pay | Source: Ambulatory Visit | Attending: Registered Nurse | Admitting: Registered Nurse

## 2023-07-30 DIAGNOSIS — R7982 Elevated C-reactive protein (CRP): Secondary | ICD-10-CM | POA: Insufficient documentation

## 2023-07-31 ENCOUNTER — Other Ambulatory Visit: Payer: Self-pay

## 2023-07-31 ENCOUNTER — Emergency Department (HOSPITAL_COMMUNITY)
Admission: EM | Admit: 2023-07-31 | Discharge: 2023-08-01 | Disposition: A | Discharge status: Home / Self Care | Payer: Self-pay | Attending: Emergency Medicine | Admitting: Emergency Medicine

## 2023-07-31 ENCOUNTER — Emergency Department (HOSPITAL_COMMUNITY): Payer: BC Managed Care – PPO

## 2023-07-31 ENCOUNTER — Encounter (HOSPITAL_COMMUNITY): Payer: Self-pay

## 2023-07-31 DIAGNOSIS — R079 Chest pain, unspecified: Secondary | ICD-10-CM | POA: Diagnosis not present

## 2023-07-31 DIAGNOSIS — I10 Essential (primary) hypertension: Secondary | ICD-10-CM | POA: Insufficient documentation

## 2023-07-31 DIAGNOSIS — R0789 Other chest pain: Secondary | ICD-10-CM | POA: Diagnosis not present

## 2023-07-31 DIAGNOSIS — E876 Hypokalemia: Secondary | ICD-10-CM | POA: Diagnosis not present

## 2023-07-31 LAB — BASIC METABOLIC PANEL
Anion gap: 7 (ref 5–15)
BUN: 20 mg/dL (ref 8–23)
CO2: 27 mmol/L (ref 22–32)
Calcium: 8.8 mg/dL — ABNORMAL LOW (ref 8.9–10.3)
Chloride: 104 mmol/L (ref 98–111)
Creatinine, Ser: 0.59 mg/dL (ref 0.44–1.00)
GFR, Estimated: >60 mL/min (ref >60)
Glucose, Bld: 85 mg/dL (ref 70–99)
Potassium: 3.2 mmol/L — ABNORMAL LOW (ref 3.5–5.1)
Sodium: 138 mmol/L (ref 135–145)

## 2023-07-31 LAB — CBC
HCT: 41.6 % (ref 36.0–46.0)
Hemoglobin: 14 g/dL (ref 12.0–15.0)
MCH: 31.4 pg (ref 26.0–34.0)
MCHC: 33.7 g/dL (ref 30.0–36.0)
MCV: 93.3 fL (ref 80.0–100.0)
Platelets: 295 10*3/uL (ref 150–400)
RBC: 4.46 MIL/uL (ref 3.87–5.11)
RDW: 11.8 % (ref 11.5–15.5)
WBC: 6.7 10*3/uL (ref 4.0–10.5)
nRBC: 0 % (ref 0.0–0.2)

## 2023-07-31 LAB — TROPONIN I (HIGH SENSITIVITY)
Troponin I (High Sensitivity): 3 ng/L (ref <18)
Troponin I (High Sensitivity): 3 ng/L (ref <18)

## 2023-07-31 NOTE — ED Provider Triage Note (Signed)
Emergency Medicine Provider Triage Evaluation Note  Angela Kim , a 62 y.o. female  was evaluated in triage.  Pt complains of left sided chest heaviness associated with SOB, intermittent for the past week. Worsened today. No association to exertion. Calcium score 95th percentile for age (performed yesterday)  Review of Systems  Positive: CP Negative: fever  Physical Exam  BP (!) 173/89   Pulse 85   Temp 98.4 F (36.9 C) (Oral)   Resp 18   Ht 5\' 6"  (1.676 m)   Wt 73.9 kg   LMP 07/28/2011   SpO2 100%   BMI 26.31 kg/m  Gen:   Awake, no distress   Resp:  Normal effort  MSK:   Moves extremities without difficulty  Other:    Medical Decision Making  Medically screening exam initiated at 9:00 PM.  Appropriate orders placed.  Angela Kim was informed that the remainder of the evaluation will be completed by another provider, this initial triage assessment does not replace that evaluation, and the importance of remaining in the ED until their evaluation is complete.  Cardiac labs   Angela Kim 07/31/23 2101

## 2023-07-31 NOTE — ED Triage Notes (Signed)
Chest pressure since this AM. Pt states pain is in left side and does not radiate. Denies nausea. C/o dizziness.

## 2023-08-01 NOTE — Discharge Instructions (Signed)
Thank you for allowing [place] to take care of you today.  We hope you begin feeling better soon.  To-Do: For your low potassium, increase your dietary intake of potassium containing foods (see print out). Please follow-up with your primary doctor to have your potassium rechecked in the next 2-4 weeks.  As discussed, follow up with cardiology for calcium score results. Please return to the Emergency Department or call 911 if you experience chest pain, shortness of breath, severe pain, severe fever, altered mental status, or have any reason to think that you need emergency medical care.  Thank you again.  Hope you feel better soon.  Department of Emergency Medicine [facility]

## 2023-08-01 NOTE — ED Provider Notes (Signed)
[place] EMERGENCY DEPARTMENT AT [facility] Provider Note  CSN: [id number] Arrival date & time: 07/31/23 2015  Chief Complaint(s) Chest Pain  HPI Angela Kim is a 62 y.o. female with a past medical history listed below including hypertension who presents to the emergency department with left-sided intermittent chest pain.  No alleviating or aggravating factors.  Pain is pressure-like.  Nonradiating.  Nonexertional.  Associated with shortness of breath.  No nausea or vomiting.  No recent fevers or infections.  No cough or congestion.  No abdominal pain.  Patient reports that she has had similar chest pain in the past and was never evaluated for it.  She recently had a calcium score of 308 earlier in the week.  Given this finding, her primary care provider recommended she be evaluated for her chest pain.  The history is provided by the patient.    Past Medical History Past Medical History:  Diagnosis Date   ADHD    Essential hypertension    Hemorrhoids    external   Patient Active Problem List   Diagnosis Date Noted   Abnormal EKG 08/04/2017   Home Medication(s) Prior to Admission medications   Medication Sig Start Date End Date Taking? Authorizing Provider  amphetamine-dextroamphetamine (ADDERALL) 20 MG tablet Take 20 mg by mouth daily.   Yes [provider]  ARMOUR THYROID 30 MG tablet Take 30 mg by mouth daily before breakfast.   Yes [provider]  estradiol (CLIMARA - DOSED IN MG/24 HR) 0.075 mg/24hr patch Place 0.075 mg onto the skin every Wednesday.   Yes [provider]  Magnesium 500 MG CAPS Take 500 mg by mouth at bedtime.   Yes [provider]  triamterene-hydrochlorothiazide (MAXZIDE-25) 37.5-25 MG tablet Take 0.5 tablets by mouth daily.   Yes [provider]  zolpidem (AMBIEN) 5 MG tablet Take 5 mg by mouth at bedtime as needed for sleep.   Yes [provider]                                                                                                                                     Allergies Patient has no known allergies.  Review of Systems Review of Systems As noted in HPI  Physical Exam Vital Signs  I have reviewed the triage vital signs BP 131/84   Pulse 73   Temp (!) 97.4 F (36.3 C) (Oral)   Resp 18   Ht 5\' 6"  (1.676 m)   Wt 73.9 kg   LMP 07/28/2011   SpO2 100%   BMI 26.31 kg/m   Physical Exam  ED Results and Treatments Labs (all labs ordered are listed, but only abnormal results are displayed) Labs Reviewed  BASIC METABOLIC PANEL - Abnormal; Notable for the following components:      Result Value   Potassium 3.2 (*)    Calcium 8.8 (*)    All other components within normal  limits  CBC  TROPONIN I (HIGH SENSITIVITY)  TROPONIN I (HIGH SENSITIVITY)                                                                                                                         EKG  EKG Interpretation Date/Time:  Friday July 31 2023 20:25:10 EDT Ventricular Rate:  80 PR Interval:  141 QRS Duration:  82 QT Interval:  382 QTC Calculation: 441 R Axis:   48  Text Interpretation: Sinus rhythm No old tracing to compare Confirmed by Drema Pry [id number]) on 08/01/2023 2:06:54 AM       Radiology DG Chest Port 1 View  Result Date: 07/31/2023 CLINICAL DATA:  Chest pain EXAM: PORTABLE CHEST 1 VIEW COMPARISON:  None Available. FINDINGS: Lungs are clear.  No pleural effusion or pneumothorax. The heart is normal in size. IMPRESSION: No acute cardiopulmonary disease. Electronically Signed   By: Charline Bills M.D.   On: 07/31/2023 21:34    Medications Ordered in ED Medications - No data to display Procedures Procedures  (including critical care time) Medical Decision Making / ED Course   Medical Decision Making Amount and/or Complexity of Data Reviewed Labs: ordered.    Differential diagnoses listed below.  Atypical chest pain for ACS.  EKG without  acute ischemic changes or evidence of pericarditis.  Heart score given new CT calcium score is 3.  Serial troponins negative x 2.  Sufficient to rule out ACS. Presentation not classic for dissection or esophageal perforation. Suspicion for pulmonary embolism. Chest x-ray without evidence of pneumonia, pneumothorax, pleural effusions or pulmonary edema. CBC without leukocytosis or anemia Metabolic panel with mild hypokalemia -likely related to patient's BP med.  No other electrolyte derangements or renal sufficiency.  Patient is asymptomatic currently. Recommended follow-up with cardiology regarding calcium score. Recommended increased dietary intake of potassium containing foods.  Follow-up with PCP for recheck.    Final Clinical Impression(s) / ED Diagnoses Final diagnoses:  Intermittent left-sided chest pain  Hypokalemia   The patient appears reasonably screened and/or stabilized for discharge and I doubt any other medical condition or other [facility] requiring further screening, evaluation, or treatment in the ED at this time. I have discussed the findings, Dx and Tx plan with the patient/family who expressed understanding and agree(s) with the plan. Discharge instructions discussed at length. The patient/family was given strict return precautions who verbalized understanding of the instructions. No further questions at time of discharge.  Disposition: Discharge  Condition: Good  ED Discharge Orders     None         Follow Up: No follow-up provider specified.   This chart was dictated using voice recognition software.  Despite best efforts to proofread,  errors can occur which can change the documentation meaning.    Nira Conn, MD 08/01/23 [phone]

## 2023-08-22 ENCOUNTER — Encounter: Payer: Self-pay | Admitting: Internal Medicine

## 2023-08-22 NOTE — Progress Notes (Signed)
Pt has contacted me and given me permission to review her medical record 08/22/23

## 2023-08-27 ENCOUNTER — Telehealth (HOSPITAL_BASED_OUTPATIENT_CLINIC_OR_DEPARTMENT_OTHER): Payer: Self-pay

## 2023-08-27 DIAGNOSIS — L710 Perioral dermatitis: Secondary | ICD-10-CM | POA: Diagnosis not present

## 2023-08-27 DIAGNOSIS — K130 Diseases of lips: Secondary | ICD-10-CM | POA: Diagnosis not present

## 2023-08-27 NOTE — Telephone Encounter (Addendum)
Patient scheduled   ----- Message from [facility] sent at 08/27/2023 11:16 AM EDT ----- Juliette Alcide, can we please see if she is available to add on after my last patient next Tuesday the 15th?  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

## 2023-09-01 ENCOUNTER — Ambulatory Visit (HOSPITAL_BASED_OUTPATIENT_CLINIC_OR_DEPARTMENT_OTHER): Payer: BC Managed Care – PPO | Admitting: Cardiovascular Disease

## 2023-09-01 ENCOUNTER — Encounter (HOSPITAL_BASED_OUTPATIENT_CLINIC_OR_DEPARTMENT_OTHER): Payer: Self-pay

## 2023-09-01 ENCOUNTER — Encounter (HOSPITAL_BASED_OUTPATIENT_CLINIC_OR_DEPARTMENT_OTHER): Payer: Self-pay | Admitting: Cardiovascular Disease

## 2023-09-01 VITALS — BP 146/82 | HR 91 | Ht 66.0 in | Wt 168.0 lb

## 2023-09-01 DIAGNOSIS — R072 Precordial pain: Secondary | ICD-10-CM | POA: Diagnosis not present

## 2023-09-01 DIAGNOSIS — I2510 Atherosclerotic heart disease of native coronary artery without angina pectoris: Secondary | ICD-10-CM | POA: Diagnosis not present

## 2023-09-01 DIAGNOSIS — Z6827 Body mass index (BMI) 27.0-27.9, adult: Secondary | ICD-10-CM

## 2023-09-01 DIAGNOSIS — E7800 Pure hypercholesterolemia, unspecified: Secondary | ICD-10-CM

## 2023-09-01 DIAGNOSIS — R079 Chest pain, unspecified: Secondary | ICD-10-CM

## 2023-09-01 MED ORDER — VALSARTAN 160 MG PO TABS: 160.0000 mg | ORAL_TABLET | Freq: Every day | ORAL | 3 refills | Status: DC

## 2023-09-01 MED ORDER — METOPROLOL TARTRATE 100 MG PO TABS: ORAL_TABLET | ORAL | 0 refills | Status: DC

## 2023-09-01 NOTE — Progress Notes (Signed)
Cardiology Office Note:  .   Date:  09/07/2023  ID:  Angela Kim, DOB Aug 20, 1961, MRN [id number] PCP: Angela Brunette, MD  [facility] Providers Cardiologist:  None    History of Present Illness: .   Angela Kim is a 62 y.o. female with hypertension and coronary calicifcation here for evaluation of chest pain.  She was seen in the ED 07/2023 with chest pain.  It was thought to be non-ischemic.   Angela Kim presents with concerns about a high cardiac calcium score and intermittent chest pressure. The chest discomfort, described as pressure rather than pain, was located on the left side and was constant throughout the day it was most noticeable. She denies any exacerbating or relieving factors. She also reported feeling more winded recently, but denied any significant exertional symptoms.  Angela Kim reports experiencing increased swelling in the left leg, which has also been aching. She is self-employed and maintains an active lifestyle, often on her feet throughout the day. Despite this, the patient reported feeling persistently tired and has been struggling with weight gain.  She has a family history of AFib and ovarian cancer, but no immediate family history of heart attacks or heart failure. She is currently on an estrogen patch (Estradiol) following a hysterectomy, and recently started thyroid medication. She was previously on a blood pressure medication, which was discontinued due to concerns about low potassium levels.   Angela Kim mostly home-cooked meals, with occasional dining out. She does not actively limit salt intake and consumes one cup of coffee daily. For pain management, the patient occasionally takes Advil, less than once a week.      She previously saw Angela Kim in 2018 for abnormal EKG and palpitations.  EKG finding were very nonspecific and no further testing was recommended at the time.    ROS:  As per HPI  Studies Reviewed: Marland Kitchen   EKG  Interpretation Date/Time:  Tuesday September 01 2023 10:31:46 EDT Ventricular Rate:  91 PR Interval:  136 QRS Duration:  76 QT Interval:  362 QTC Calculation: 445 R Axis:   33  Text Interpretation: Normal sinus rhythm Normal ECG Confirmed by Chilton Si ([id number]) on 09/01/2023 10:38:58 AM   Coronary calcium score 92024: IMPRESSION: Coronary calcium score of 308 Agatston units. This was 95th percentile for age-, race-, and sex-matched controls.  Risk Assessment/Calculations:         Physical Exam:   VS:  BP (!) 146/82   Pulse 91   Ht 5\' 6"  (1.676 m)   Wt 168 lb (76.2 kg)   LMP 07/28/2011   SpO2 99%   BMI 27.12 kg/m    Wt Readings from Last 3 Encounters:  09/01/23 168 lb (76.2 kg)  07/31/23 163 lb (73.9 kg)  07/28/17 151 lb 3.2 oz (68.6 kg)    GEN: Well nourished, well developed in no acute distress NECK: No JVD; No carotid bruits CARDIAC: RRR, no murmurs, rubs, gallops RESPIRATORY:  Clear to auscultation without rales, wheezing or rhonchi  ABDOMEN: Soft, non-tender, non-distended EXTREMITIES:  No edema; No deformity   ASSESSMENT AND PLAN: .    # Coronary Artery Disease Elevated cardiac calcium score with non-specific chest discomfort. Discussed the limitations of calcium scoring and the need for further evaluation with coronary CTA to assess the severity of the disease and to fully rule out ischemia.  -Order coronary CTA. -Start Aspirin 81mg  daily for secondary prevention.  # Hypertension Elevated blood pressure readings after discontinuation of hydrochlorothiazide due to low  potassium. Discussed the need to control blood pressure with an alternative medication. -Start Valsartan once daily. -Check blood pressure at home and record readings.  # Hyperlipidemia No recent lipid profile available. Discussed the need for aggressive lipid management in the context of coronary artery disease.  LDL goal is <70 and she will likely require a statin.  -Order fasting lipid  panel and LP(a).  # Weight Gain Patient expressed concern about recent weight gain. Discussed potential benefits of weight loss medications. -Consult with pharmacist regarding weight loss medication options after lab results.  # Estrogen Replacement Therapy Patient currently on Estradiol patch. Discussed potential cardiovascular risks associated with estrogen therapy in the context of known coronary artery disease. -Recommend discussion with OB/GYN regarding potential weaning or discontinuation of estrogen therapy.  General Health Maintenance -Order comprehensive metabolic panel, thyroid function tests, and hemoglobin A1c. -Follow-up in a couple of months to assess blood pressure control and discuss lab results.      Dispo:  F/u 2-3 months.   Signed, Chilton Si, MD

## 2023-09-01 NOTE — Patient Instructions (Addendum)
Medication Instructions:  TAKE METOPROLOL 100 MG 2 HOURS PRIOR TO CT  START VALSARTAN 160 MG DAILY   STOP TRIAMTERENE    *If you need a refill on your cardiac medications before your next appointment, please call your pharmacy*  Lab Work: FASTING LP/CMET/LPa IN 1 WEEK   If you have labs (blood work) drawn today and your tests are completely normal, you will receive your results only by: MyChart Message (if you have MyChart) OR A paper copy in the mail If you have any lab test that is abnormal or we need to change your treatment, we will call you to review the results.  Testing/Procedures: Your physician has requested that you have cardiac CT. Cardiac computed tomography (CT) is a painless test that uses an x-ray machine to take clear, detailed pictures of your heart. For further information please visit [email or web address] Please follow instruction sheet as given.  Follow-Up: At [facility], you and your health needs are our priority.  As part of our continuing mission to provide you with exceptional heart care, we have created designated Provider Care Teams.  These Care Teams include your primary Cardiologist (physician) and Advanced Practice Providers (APPs -  Physician Assistants and Nurse Practitioners) who all work together to provide you with the care you need, when you need it.  We recommend signing up for the patient portal called "MyChart".  Sign up information is provided on this After Visit Summary.  MyChart is used to connect with patients for Virtual Visits (Telemedicine).  Patients are able to view lab/test results, encounter notes, upcoming appointments, etc.  Non-urgent messages can be sent to your provider as well.   To learn more about what you can do with MyChart, go to [email or web address].    Your next appointment:   2 month(s)  Provider:   Gillian Shields, NP     Other Instructions MONITOR BLOOD PRESSURE AT HOME AND LOG. BRING YOUR READINGS AND  MACHINE TO FOLLOW UP IN 2 MONTH    Your cardiac CT will be scheduled at one of the below locations:   [facility] [address] [place], [place] [address] [phone]  OR  [facility] [address] [place], [place] [address] [phone]  OR   [facility] [address] [place], [place] [address] [phone]  If scheduled at [facility], please arrive at the [facility] and Children's Entrance (Entrance C2) of [facility] 30 minutes prior to test start time. You can use the FREE valet parking offered at entrance C (encouraged to control the heart rate for the test)  Proceed to the [facility] Radiology Department (first floor) to check-in and test prep.  All radiology patients and guests should use entrance C2 at [facility], accessed from [facility], even though the hospital's physical address listed is [address]    If scheduled at [facility] or [facility], please arrive 15 mins early for check-in and test prep.  There is spacious parking and easy access to the radiology department from the [facility] entrance. Please enter here and check-in with the desk attendant.   Please follow these instructions carefully (unless otherwise directed):  An IV will be required for this test and Nitroglycerin will be given.  Hold all erectile dysfunction medications at least 3 days (72 hrs) prior to test. (Ie viagra, cialis, sildenafil, tadalafil, etc)   On  the Night Before the Test: Be sure to Drink plenty of water. Do not consume any caffeinated/decaffeinated beverages or chocolate 12 hours prior to your test. Do not take any antihistamines 12 hours prior to your test.  On the Day of the Test: Drink plenty of water until 1 hour prior to the test. Do not eat any food 1 hour prior to  test. You may take your regular medications prior to the test.  Take metoprolol (Lopressor) two hours prior to test. If you take Furosemide/Hydrochlorothiazide/Spironolactone, please HOLD on the morning of the test. FEMALES- please wear underwire-free bra if available, avoid dresses & tight clothing      After the Test: Drink plenty of water. After receiving IV contrast, you may experience a mild flushed feeling. This is normal. On occasion, you may experience a mild rash up to 24 hours after the test. This is not dangerous. If this occurs, you can take Benadryl 25 mg and increase your fluid intake. If you experience trouble breathing, this can be serious. If it is severe call 911 IMMEDIATELY. If it is mild, please call our office. If you take any of these medications: Glipizide/Metformin, Avandament, Glucavance, please do not take 48 hours after completing test unless otherwise instructed.  We will call to schedule your test 2-4 weeks out understanding that some insurance companies will need an authorization prior to the service being performed.   For more information and frequently asked questions, please visit our website : [email or web address]  For non-scheduling related questions, please contact the cardiac imaging nurse navigator should you have any questions/concerns: Cardiac Imaging Nurse Navigators Direct Office Dial: [phone]   For scheduling needs, including cancellations and rescheduling, please call [place], [phone].  Cardiac CT Angiogram A cardiac CT angiogram is a procedure to look at the heart and the area around the heart. It may be done to help find the cause of chest pains or other symptoms of heart disease. During this procedure, a substance called contrast dye is injected into a vein in the arm. The contrast highlights the blood vessels in the area to be checked. A large X-ray machine (CT scanner), then takes detailed pictures of the heart and the  surrounding area. The procedure is also sometimes called a coronary CT angiogram, coronary artery scanning, or CTA. A cardiac CT angiogram allows the health care provider to see how well blood is flowing to and from the heart. The provider will be able to see if there are any problems, such as: Blockage or narrowing of the arteries in the heart. Fluid around the heart. Signs of weakness or disease in the muscles, valves, and tissues of the heart. Tell a health care provider about: Any allergies you have. This is especially important if you have had a previous allergic reaction to medicines, contrast dye, or iodine. All medicines you are taking, including vitamins, herbs, eye drops, creams, and over-the-counter medicines. Any bleeding problems you have. Any surgeries you have had. Any medical conditions you have, including kidney problems or kidney failure. Whether you are pregnant or may be pregnant. Any anxiety disorders, chronic pain, or other conditions you have. These may increase your stress or prevent you from lying still. Any history of abnormal heart rhythms or heart procedures. What are the risks? Your provider will talk with you about risks. These may include: Bleeding. Infection. Allergic reactions to medicines or dyes. Damage to other structures or organs. Kidney damage from the contrast dye. Increased risk of cancer from radiation exposure. This  risk is low. Talk with your provider about: The risks and benefits of testing. How you can receive the lowest dose of radiation. What happens before the procedure? Wear comfortable clothing and remove any jewelry, glasses, dentures, and hearing aids. Follow instructions from your provider about eating and drinking. These may include: 12 hours before the procedure Avoid caffeine. This includes tea, coffee, soda, energy drinks, and diet pills. Drink plenty of water or other fluids that do not have caffeine in them. Being well hydrated  can prevent complications. 4-6 hours before the procedure Stop eating and drinking. This will reduce the risk of nausea from the contrast dye. Ask your provider about changing or stopping your regular medicines. These include: Diabetes medicines. Medicines to treat problems with erections (erectile dysfunction). If you have kidney problems, you may need to receive IV hydration before and after the test. What happens during the procedure?  Hair on your chest may need to be removed so that small sticky patches called electrodes can be placed on your chest. These will transmit information that helps to monitor your heart during the procedure. An IV will be inserted into one of your veins. You might be given a medicine to control your heart rate during the procedure. This will help to ensure that good images are obtained. You will be asked to lie on an exam table. This table will slide in and out of the CT machine during the procedure. Contrast dye will be injected into the IV. You might feel warm, or you may get a metallic taste in your mouth. You may be given medicines to relax or dilate the arteries in your heart. If you are allergic to contrast dyes or iodine you may be given medicine before the test to reduce the risk of an allergic reaction. The table that you are lying on will move into the CT machine tunnel for the scan. The person running the machine will give you instructions while the scans are being done. You may be asked to: Keep your arms above your head. Hold your breath for short periods. Stay very still, even if the table is moving. The procedure may vary among providers and hospitals. What can I expect after the procedure? After your procedure, it is common to have: A metallic taste in your mouth from the contrast dye. A feeling of warmth. A headache from the heart medicine. Follow these instructions at home: Take over-the-counter and prescription medicines only as told by  your provider. If you are told, drink enough fluid to keep your pee pale yellow. This will help to flush the contrast dye out of your body. Most people can return to their normal activities right after the procedure. Ask your provider what activities are safe for you. It is up to you to get the results of your procedure. Ask your provider, or the department that is doing the procedure, when your results will be ready. Contact a health care provider if: You have any symptoms of allergy to the contrast dye. These include: Shortness of breath. Rash or hives. A racing heartbeat. You notice a change in your peeing (urination). This information is not intended to replace advice given to you by your health care provider. Make sure you discuss any questions you have with your health care provider. Document Revised: 06/06/2022 Document Reviewed: 06/06/2022 Elsevier Patient Education  2024 [employer].

## 2023-09-07 ENCOUNTER — Encounter (HOSPITAL_BASED_OUTPATIENT_CLINIC_OR_DEPARTMENT_OTHER): Payer: Self-pay | Admitting: Cardiovascular Disease

## 2023-09-07 DIAGNOSIS — I2510 Atherosclerotic heart disease of native coronary artery without angina pectoris: Secondary | ICD-10-CM | POA: Insufficient documentation

## 2023-09-07 DIAGNOSIS — E7800 Pure hypercholesterolemia, unspecified: Secondary | ICD-10-CM

## 2023-09-07 DIAGNOSIS — R072 Precordial pain: Secondary | ICD-10-CM

## 2023-09-07 HISTORY — DX: Pure hypercholesterolemia, unspecified: E78.00

## 2023-09-07 HISTORY — DX: Precordial pain: R07.2

## 2023-09-07 HISTORY — DX: Atherosclerotic heart disease of native coronary artery without angina pectoris: I25.10

## 2023-09-08 DIAGNOSIS — R079 Chest pain, unspecified: Secondary | ICD-10-CM | POA: Diagnosis not present

## 2023-09-10 DIAGNOSIS — F900 Attention-deficit hyperactivity disorder, predominantly inattentive type: Secondary | ICD-10-CM | POA: Diagnosis not present

## 2023-09-11 ENCOUNTER — Encounter (HOSPITAL_COMMUNITY): Payer: Self-pay

## 2023-09-11 LAB — COMPREHENSIVE METABOLIC PANEL
ALT: 20 [IU]/L (ref 0–32)
AST: 20 [IU]/L (ref 0–40)
Albumin: 4.2 g/dL (ref 3.9–4.9)
Alkaline Phosphatase: 87 [IU]/L (ref 44–121)
BUN/Creatinine Ratio: 25 (ref 12–28)
BUN: 15 mg/dL (ref 8–27)
Bilirubin Total: 0.4 mg/dL (ref 0.0–1.2)
CO2: 23 mmol/L (ref 20–29)
Calcium: 9 mg/dL (ref 8.7–10.3)
Chloride: 105 mmol/L (ref 96–106)
Creatinine, Ser: 0.6 mg/dL (ref 0.57–1.00)
Globulin, Total: 2.1 g/dL (ref 1.5–4.5)
Glucose: 82 mg/dL (ref 70–99)
Potassium: 4.3 mmol/L (ref 3.5–5.2)
Sodium: 140 mmol/L (ref 134–144)
Total Protein: 6.3 g/dL (ref 6.0–8.5)
eGFR: 101 mL/min/{1.73_m2} (ref >59)

## 2023-09-11 LAB — LIPID PANEL
Chol/HDL Ratio: 3.2 {ratio} (ref 0.0–4.4)
Cholesterol, Total: 211 mg/dL — ABNORMAL HIGH (ref 100–199)
HDL: 66 mg/dL (ref >39)
LDL Chol Calc (NIH): 135 mg/dL — ABNORMAL HIGH (ref 0–99)
Triglycerides: 56 mg/dL (ref 0–149)
VLDL Cholesterol Cal: 10 mg/dL (ref 5–40)

## 2023-09-11 LAB — LIPOPROTEIN A (LPA): Lipoprotein (a): <8.4 nmol/L (ref <75.0)

## 2023-09-15 ENCOUNTER — Telehealth (HOSPITAL_COMMUNITY): Payer: Self-pay | Admitting: *Deleted

## 2023-09-15 NOTE — Telephone Encounter (Signed)
Reaching out to patient to offer assistance regarding upcoming cardiac imaging study; pt verbalizes understanding of appt date/time, parking situation and where to check in, pre-test NPO status and medications ordered, and verified current allergies; name and call back number provided for further questions should they arise  Larey Brick RN Navigator Cardiac Imaging [facility] [phone] office [phone] cell  Patient to take 100mg  metoprolol tartrate two hours prior to your cardiac CT scan. She is aware to arrive at 11:30 AM.

## 2023-09-16 ENCOUNTER — Ambulatory Visit (HOSPITAL_COMMUNITY)
Admission: RE | Admit: 2023-09-16 | Discharge: 2023-09-16 | Disposition: A | Discharge status: Home / Self Care | Payer: BC Managed Care – PPO | Source: Ambulatory Visit | Attending: Cardiovascular Disease | Admitting: Cardiovascular Disease

## 2023-09-16 DIAGNOSIS — R072 Precordial pain: Secondary | ICD-10-CM

## 2023-09-16 DIAGNOSIS — R079 Chest pain, unspecified: Secondary | ICD-10-CM | POA: Diagnosis not present

## 2023-09-16 MED ORDER — NITROGLYCERIN 0.4 MG SL SUBL
0.8000 mg | SUBLINGUAL_TABLET | Freq: Once | SUBLINGUAL | Status: AC
  Administered 2023-09-16: 0.8 mg via SUBLINGUAL

## 2023-09-16 MED ORDER — NITROGLYCERIN 0.4 MG SL SUBL
SUBLINGUAL_TABLET | SUBLINGUAL | Status: AC
  Filled 2023-09-16: qty 2

## 2023-09-16 MED ORDER — IOHEXOL 350 MG/ML SOLN
100.0000 mL | Freq: Once | INTRAVENOUS | Status: AC | PRN
  Administered 2023-09-16: 100 mL via INTRAVENOUS

## 2023-10-01 DIAGNOSIS — Z6828 Body mass index (BMI) 28.0-28.9, adult: Secondary | ICD-10-CM | POA: Diagnosis not present

## 2023-10-01 DIAGNOSIS — Z13820 Encounter for screening for osteoporosis: Secondary | ICD-10-CM | POA: Diagnosis not present

## 2023-10-01 DIAGNOSIS — Z1272 Encounter for screening for malignant neoplasm of vagina: Secondary | ICD-10-CM | POA: Diagnosis not present

## 2023-10-01 DIAGNOSIS — Z1231 Encounter for screening mammogram for malignant neoplasm of breast: Secondary | ICD-10-CM | POA: Diagnosis not present

## 2023-10-01 DIAGNOSIS — Z01419 Encounter for gynecological examination (general) (routine) without abnormal findings: Secondary | ICD-10-CM | POA: Diagnosis not present

## 2023-10-05 ENCOUNTER — Telehealth (HOSPITAL_BASED_OUTPATIENT_CLINIC_OR_DEPARTMENT_OTHER): Payer: Self-pay

## 2023-10-05 DIAGNOSIS — E7800 Pure hypercholesterolemia, unspecified: Secondary | ICD-10-CM

## 2023-10-05 MED ORDER — ROSUVASTATIN CALCIUM 20 MG PO TABS: 20.0000 mg | ORAL_TABLET | Freq: Every day | ORAL | 3 refills | Status: DC

## 2023-10-05 NOTE — Telephone Encounter (Signed)
-----   Message from [facility] sent at 10/02/2023  8:45 PM EST ----- Normal kidney function, liver function, and electrolytes.  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  Given that you do have some plaque in your heart arteries, your LDL should be less than 70 to prevent this from getting worse.  Currently it is 135.  Recommend starting rosuvastatin 20 mg daily.  Repeat lipids and a CMP in 2 to 3 months.

## 2023-10-08 ENCOUNTER — Ambulatory Visit (HOSPITAL_BASED_OUTPATIENT_CLINIC_OR_DEPARTMENT_OTHER): Payer: BC Managed Care – PPO | Admitting: Pharmacist Clinician (PhC)/ Clinical Pharmacy Specialist

## 2023-10-08 ENCOUNTER — Other Ambulatory Visit (HOSPITAL_BASED_OUTPATIENT_CLINIC_OR_DEPARTMENT_OTHER): Payer: Self-pay

## 2023-10-08 VITALS — BP 127/84 | HR 76 | Ht 66.0 in | Wt 173.4 lb

## 2023-10-08 DIAGNOSIS — I10 Essential (primary) hypertension: Secondary | ICD-10-CM

## 2023-10-08 MED ORDER — WEGOVY 0.25 MG/0.5ML ~~LOC~~ SOAJ: 0.2500 mg | SUBCUTANEOUS | Status: DC

## 2023-10-08 NOTE — Progress Notes (Unsigned)
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 female patient of Dr Duke Salvia, in the office for follow up.  She was seen in the ED 07/2023 with chest pain. It was thought to be non-ischemic. She saw Dr. Duke Salvia on 09/01/23. She reported feeling more winded, but denied any significant exertional symptoms. She mentioned having some increased swelling in her left leg and well as some aching. She is self-employed and maintains an active lifestyle, often on her feet throughout the day. Despite this, the patient reported feeling persistently tired and has been struggling with weight gain. During her visit, her blood pressure was 146/82. We started her on Valsartan. She was taking hydrochlorothiazide, but this was discontinued due to low potassium.    Today the patient is seen for hypertension follow-up. Her blood pressure today was 127/84. She has not been monitoring her BP at home. However, she saw her gynecologist last week and her blood pressure was 123/76. She has been adhering to her medications. She has complaints of being tired and gaining weight. She is postmenopausal. She started tapering off her estradiol patch 6 weeks ago and is now completely off it. She has been trying to come off zolpidem as well due to the fear of getting Alzheimer's. She has tried melatonin and that hasn't been helpful.   Blood Pressure Goal:  130/80  Current Medications:  valsartan 160 mg  Family Hx: mother died ovarian cancer, father living, has AF  Social Hx:     Drinks alcohol (socially 1-2 glasses of wine a  week) No drug use Does not smoke  Diet:  Her diet has been minimal. She doesn't eat a lot sweets, but she does enjoy chicken and broccoli salad.  Exercise:  She gets about 10,000 steps per day between doing yard work and occasional gym time.   Home BP readings:  no home readings with her today   Accessory Clinical Findings    Lab Results  Component Value Date   CREATININE 0.60 09/08/2023   BUN 15 09/08/2023   NA 140 09/08/2023   K 4.3 09/08/2023   CL 105 09/08/2023   CO2 23 09/08/2023   Lab Results  Component Value Date   ALT 20 09/08/2023   AST 20 09/08/2023   ALKPHOS 87 09/08/2023   BILITOT 0.4 09/08/2023   No results found for: "HGBA1C"  Home Medications    Current Outpatient Medications  Medication Sig Dispense Refill   Semaglutide-Weight Management (WEGOVY) 0.25 MG/0.5ML SOAJ Inject 0.25 mg into the skin every 7 (seven) days.     ARMOUR THYROID 30 MG tablet Take 30 mg by mouth daily before breakfast.     Magnesium 500 MG CAPS Take 500 mg by mouth at bedtime.     rosuvastatin (CRESTOR) 20 MG tablet Take 1 tablet (20 mg total) by mouth daily. 90 tablet 3   valsartan (DIOVAN) 160 MG tablet Take 1 tablet (160 mg total) by mouth daily. D/C MAXZIDE 90 tablet 3   zolpidem (AMBIEN) 5 MG tablet Take 5  mg by mouth at bedtime as needed for sleep.     No current facility-administered medications for this visit.         Assessment & Plan    Hypertension A: Goal <130/80. Her blood pressure today was 127/84. Currently controlled on valsartan 160 mg tablets daily. Currently, no changes are recommended towards her BP regimen. She has expressed interest in weight loss options. We discussed Wegovy and Zepbound.   P: Continue with all medications. The patient is scheduled for follow up on 11/02/2023.  Buddy Duty PharmD Candidate [facility] Class of 2025  I was with student and patient for entire visit and agree with above assessment and plan.     Reviewed information about weight loss medications and patient would like to start.  Will start with sample of Wegovy 0.25 mg weekly and see if insurance will cover further doses.  Patient willing to pay cash prices.  If not covered will switch to [employer] from [employer], as is cheaper and patient should do well with just lower doses.     Phillips Hay PharmD CPP [facility]  [address] [place], [place] [address] [phone]

## 2023-10-08 NOTE — Patient Instructions (Signed)
Follow up appointment: in 3 months - we will schedule this in the new year.    Take your BP meds as follows:  Continue with valsartan 160 mg once daily  Start Wegovy 0.25 mg once weekly.   I will reach out to your insurance to see if [facility] or Zepbound are covered on your plan.  If not we will send a prescription to the manufacturer for Zepbound 2.5 mg weekly for 4 weeks then 5 mg weekly thereafter.     Go to [email or web address] to see more information about pricing  Check your blood pressure at home daily (if able) and keep record of the readings.  Hypertension "High blood pressure"  Hypertension is often called "The Silent Killer." It rarely causes symptoms until it is extremely  high or has done damage to other organs in the body. For this reason, you should have your  blood pressure checked regularly by your physician. We will check your blood pressure  every time you see a provider at one of our offices.   Your blood pressure reading consists of two numbers. Ideally, blood pressure should be  below 120/80. The first ("top") number is called the systolic pressure. It measures the  pressure in your arteries as your heart beats. The second ("bottom") number is called the diastolic pressure. It measures the pressure in your arteries as the heart relaxes between beats.  The benefits of getting your blood pressure under control are enormous. A 10-point  reduction in systolic blood pressure can reduce your risk of stroke by 27% and heart failure by 28%  Your blood pressure goal is < 130/80  To check your pressure at home you will need to:  1. Sit up in a chair, with feet flat on the floor and back supported. Do not cross your ankles or legs. 2. Rest your left arm so that the cuff is about heart level. If the cuff goes on your upper arm,  then just relax the arm on the table, arm of the chair or your lap. If you have a wrist cuff, we  suggest relaxing your wrist against your chest (think  of it as Pledging the Flag with the  wrong arm).  3. Place the cuff snugly around your arm, about 1 inch above the crook of your elbow. The  cords should be inside the groove of your elbow.  4. Sit quietly, with the cuff in place, for about 5 minutes. After that 5 minutes press the power  button to start a reading. 5. Do not talk or move while the reading is taking place.  6. Record your readings on a sheet of paper. Although most cuffs have a memory, it is often  easier to see a pattern developing when the numbers are all in front of you.  7. You can repeat the reading after 1-3 minutes if it is recommended  Make sure your bladder is empty and you have not had caffeine or tobacco within the last 30 min  Always bring your blood pressure log with you to your appointments. If you have not brought your monitor in to be double checked for accuracy, please bring it to your next appointment.  You can find a list of quality blood pressure cuffs at validatebp.org

## 2023-10-09 ENCOUNTER — Telehealth: Payer: Self-pay | Admitting: Pharmacist Clinician (PhC)/ Clinical Pharmacy Specialist

## 2023-10-09 ENCOUNTER — Encounter (HOSPITAL_BASED_OUTPATIENT_CLINIC_OR_DEPARTMENT_OTHER): Payer: Self-pay | Admitting: Pharmacist Clinician (PhC)/ Clinical Pharmacy Specialist

## 2023-10-09 ENCOUNTER — Telehealth: Payer: Self-pay | Admitting: Pharmacy Technician

## 2023-10-09 ENCOUNTER — Other Ambulatory Visit (HOSPITAL_COMMUNITY): Payer: Self-pay

## 2023-10-09 DIAGNOSIS — I10 Essential (primary) hypertension: Secondary | ICD-10-CM | POA: Insufficient documentation

## 2023-10-09 NOTE — Assessment & Plan Note (Signed)
A: Goal <130/80. Her blood pressure today was 127/84. Currently controlled on valsartan 160 mg tablets daily. Currently, no changes are recommended towards her BP regimen. She has expressed interest in weight loss options. We discussed Wegovy and Zepbound.   P: Continue with all medications. The patient is scheduled for follow up on 11/02/2023.

## 2023-10-09 NOTE — Telephone Encounter (Signed)
Please run test claim to see if Wegovy or Zepbound would be covered on insurance.  Complete PA if needed.

## 2023-10-09 NOTE — Telephone Encounter (Signed)
Pharmacy Patient Advocate Encounter   Received notification from Pt Calls Messages that prior authorization for zepbound is required/requested.   Insurance verification completed.   The patient is insured through [facility] .   Per test claim: PA required; PA submitted to above mentioned insurance via CoverMyMeds Key/confirmation #/EOC [id number] Status is pending

## 2023-10-13 ENCOUNTER — Other Ambulatory Visit (HOSPITAL_COMMUNITY): Payer: Self-pay

## 2023-10-13 ENCOUNTER — Telehealth: Payer: Self-pay | Admitting: Pharmacy Technician

## 2023-10-13 NOTE — Telephone Encounter (Signed)
Pharmacy Patient Advocate Encounter  Received notification from [facility] that Prior Authorization for wegovy has been DENIED.  See denial reason below. No denial letter attached in CMM. Will attach denial letter to Media tab once received.  The requested service is not a covered benefit per your benefit booklet or plan documents PA #/Case ID/Reference #: [id number]

## 2023-10-13 NOTE — Telephone Encounter (Signed)
Pharmacy Patient Advocate Encounter   Received notification from Pt Calls Messages that prior authorization for wegovy is required/requested.   Insurance verification completed.   The patient is insured through [facility] .   Per test claim: PA required; PA submitted to above mentioned insurance via CoverMyMeds Key/confirmation #/EOC [id number] Status is pending

## 2023-10-13 NOTE — Telephone Encounter (Signed)
Pharmacy Patient Advocate Encounter  Received notification from [facility] that Prior Authorization for zepbound has been DENIED.  See denial reason below. No denial letter attached in CMM. Will attach denial letter to Media tab once received.   PA #/Case ID/Reference #: [id number]

## 2023-10-19 ENCOUNTER — Other Ambulatory Visit: Payer: Self-pay | Admitting: Pharmacist Clinician (PhC)/ Clinical Pharmacy Specialist

## 2023-10-19 MED ORDER — TIRZEPATIDE-WEIGHT MANAGEMENT 5 MG/0.5ML ~~LOC~~ SOLN: 5.0000 mg | SUBCUTANEOUS | 3 refills | Status: DC

## 2023-10-19 MED ORDER — TIRZEPATIDE-WEIGHT MANAGEMENT 2.5 MG/0.5ML ~~LOC~~ SOLN: 2.5000 mg | SUBCUTANEOUS | 0 refills | Status: DC

## 2023-10-19 NOTE — Telephone Encounter (Signed)
Rx sent to [facility]

## 2023-10-19 NOTE — Addendum Note (Signed)
Addended by: Rosalee Kaufman on: 10/19/2023 06:59 AM   Modules accepted: Orders

## 2023-11-02 ENCOUNTER — Ambulatory Visit (HOSPITAL_BASED_OUTPATIENT_CLINIC_OR_DEPARTMENT_OTHER): Payer: BC Managed Care – PPO | Admitting: Family

## 2023-11-06 ENCOUNTER — Other Ambulatory Visit: Payer: Self-pay | Admitting: Cardiovascular Disease

## 2023-11-09 ENCOUNTER — Telehealth: Payer: Self-pay | Admitting: Cardiovascular Disease

## 2023-11-09 MED ORDER — TIRZEPATIDE-WEIGHT MANAGEMENT 5 MG/0.5ML ~~LOC~~ SOLN: 5.0000 mg | SUBCUTANEOUS | 3 refills | Status: DC

## 2023-11-09 NOTE — Telephone Encounter (Signed)
See my chart message

## 2023-11-09 NOTE — Telephone Encounter (Signed)
Pt states she is returning a phone call from the pharmacist that got cut off. Please advise

## 2023-11-09 NOTE — Addendum Note (Signed)
Addended by: Rosalee Kaufman on: 11/09/2023 03:01 PM   Modules accepted: Orders

## 2023-12-01 ENCOUNTER — Encounter: Payer: Self-pay | Admitting: Gastroenterology

## 2024-01-11 ENCOUNTER — Ambulatory Visit (AMBULATORY_SURGERY_CENTER): Payer: BC Managed Care – PPO

## 2024-01-11 VITALS — Ht 66.0 in | Wt 160.0 lb

## 2024-01-11 DIAGNOSIS — Z1211 Encounter for screening for malignant neoplasm of colon: Secondary | ICD-10-CM

## 2024-01-11 MED ORDER — SUFLAVE 178.7 G PO SOLR: 1.0000 | ORAL | 0 refills | Status: DC

## 2024-01-11 NOTE — Progress Notes (Signed)
 No egg or soy allergy known to patient  No issues known to pt with past sedation with any surgeries or procedures Patient denies ever being told they had issues or difficulty with intubation  No FH of Malignant Hyperthermia Pt is not on diet pills Pt is not on  home 02  Pt is not on blood thinners  Pt denies issues with constipation  No A fib or A flutter Have any cardiac testing pending-- no  LOA: independent  Prep: Suflave Pt advised to hold zepbound starting 2/26 to avoid cancellation of her procedure   Patient's chart reviewed by Cathlyn Parsons CNRA prior to previsit and patient appropriate for the [facility].  Previsit completed and red dot placed by patient's name on their procedure day (on provider's schedule).     PV completed with patient. Prep instructions sent via mychart and home address.

## 2024-01-19 ENCOUNTER — Encounter: Payer: Self-pay | Admitting: Gastroenterology

## 2024-01-20 ENCOUNTER — Telehealth: Payer: Self-pay | Admitting: Gastroenterology

## 2024-01-20 DIAGNOSIS — Z1211 Encounter for screening for malignant neoplasm of colon: Secondary | ICD-10-CM

## 2024-01-20 MED ORDER — NA SULFATE-K SULFATE-MG SULF 17.5-3.13-1.6 GM/177ML PO SOLN: 1.0000 | Freq: Once | ORAL | 0 refills | Status: AC

## 2024-01-20 NOTE — Telephone Encounter (Signed)
 Inbound call from patient stating that she is scheduled for a colonoscopy tomorrow 3/6 at 10:00. Patient states she has still not received her prep. Patient went to the post office to see if they had received it and they have not. Patient is requesting a call to discuss if there is any other way to get medication. Please advise.

## 2024-01-20 NOTE — Telephone Encounter (Signed)
 Spoke with pt.  Sent Suprep to [employer].  New instructions sent via [facility].  Reviewed instructions with pt

## 2024-01-21 ENCOUNTER — Encounter: Payer: Self-pay | Admitting: Gastroenterology

## 2024-01-21 ENCOUNTER — Ambulatory Visit: Payer: BC Managed Care – PPO | Admitting: Gastroenterology

## 2024-01-21 VITALS — BP 97/57 | HR 72 | Temp 96.4°F | Resp 14 | Ht 66.0 in | Wt 160.0 lb

## 2024-01-21 DIAGNOSIS — K5730 Diverticulosis of large intestine without perforation or abscess without bleeding: Secondary | ICD-10-CM

## 2024-01-21 DIAGNOSIS — K644 Residual hemorrhoidal skin tags: Secondary | ICD-10-CM

## 2024-01-21 DIAGNOSIS — Z1211 Encounter for screening for malignant neoplasm of colon: Secondary | ICD-10-CM

## 2024-01-21 DIAGNOSIS — K640 First degree hemorrhoids: Secondary | ICD-10-CM

## 2024-01-21 MED ORDER — SODIUM CHLORIDE 0.9 % IV SOLN: 500.0000 mL | Freq: Once | INTRAVENOUS | Status: DC

## 2024-01-21 NOTE — Op Note (Signed)
 [facility] Patient Name: Angela Kim Procedure Date: 01/21/2024 10:30 AM MRN: [id number] Endoscopist: Lorin Picket E. Tomasa Rand , MD, [id number] Age: 63 Referring MD:  Date of Birth: 09-01-1961 Gender: Female Account #: [id number] Procedure:                Colonoscopy Indications:              Screening for colorectal malignant neoplasm (last                            colonoscopy was more than 10 years ago) Medicines:                Monitored Anesthesia Care Procedure:                Pre-Anesthesia Assessment:                           - Prior to the procedure, a History and Physical                            was performed, and patient medications and                            allergies were reviewed. The patient's tolerance of                            previous anesthesia was also reviewed. The risks                            and benefits of the procedure and the sedation                            options and risks were discussed with the patient.                            All questions were answered, and informed consent                            was obtained. Prior Anticoagulants: The patient has                            taken no anticoagulant or antiplatelet agents. ASA                            Grade Assessment: II - A patient with mild systemic                            disease. After reviewing the risks and benefits,                            the patient was deemed in satisfactory condition to                            undergo the procedure.  After obtaining informed consent, the colonoscope                            was passed under direct vision. Throughout the                            procedure, the patient's blood pressure, pulse, and                            oxygen saturations were monitored continuously. The                            Olympus CF-HQ190L ([id number]) Colonoscope was                            introduced through  the anus and advanced to the the                            terminal ileum, with identification of the                            appendiceal orifice and IC valve. The colonoscopy                            was performed without difficulty. The patient                            tolerated the procedure well. The quality of the                            bowel preparation was excellent. The terminal                            ileum, ileocecal valve, appendiceal orifice, and                            rectum were photographed. The bowel preparation                            used was SUPREP via split dose instruction. Scope In: 10:51:31 AM Scope Out: 11:03:28 AM Scope Withdrawal Time: 0 hours 6 minutes 29 seconds  Total Procedure Duration: 0 hours 11 minutes 57 seconds  Findings:                 Hemorrhoids were found on perianal exam.                           Skin tags were found on perianal exam.                           The digital rectal exam was normal. Pertinent                            negatives include normal sphincter tone and no  palpable rectal lesions.                           Many medium-mouthed and small-mouthed diverticula                            were found in the sigmoid colon, descending colon                            and distal transverse colon. There was narrowing of                            the colon in association with the diverticular                            opening.                           The exam was otherwise normal throughout the                            examined colon.                           The terminal ileum appeared normal.                           Non-bleeding internal hemorrhoids were found during                            retroflexion. The hemorrhoids were Grade I                            (internal hemorrhoids that do not prolapse).                           No additional abnormalities were found on                             retroflexion. Complications:            No immediate complications. Estimated Blood Loss:     Estimated blood loss: none. Impression:               - Hemorrhoids found on perianal exam.                           - Perianal skin tags found on perianal exam.                           - Moderate diverticulosis in the sigmoid colon, in                            the descending colon and in the distal transverse                            colon. There was narrowing of  the colon in                            association with the diverticular opening.                           - The examined portion of the ileum was normal.                           - Non-bleeding internal hemorrhoids.                           - No specimens collected. Recommendation:           - Patient has a contact number available for                            emergencies. The signs and symptoms of potential                            delayed complications were discussed with the                            patient. Return to normal activities tomorrow.                            Written discharge instructions were provided to the                            patient.                           - Resume previous diet.                           - Continue present medications.                           - Repeat colonoscopy in 10 years for screening                            purposes.                           - Recommend high fiber diet/daily fiber supplement                            to reduce risk of diverticular complications Alain Deschene E. Tomasa Rand, MD 01/21/2024 11:12:25 AM This report has been signed electronically.

## 2024-01-21 NOTE — Patient Instructions (Signed)

## 2024-01-21 NOTE — Progress Notes (Signed)
 Report to PACU, RN, vss, BBS= Clear.

## 2024-01-21 NOTE — Progress Notes (Signed)
 [facility] History and Physical   Primary Care Physician:  Merri Brunette, MD   Reason for Procedure:   Colon cancer screening  Plan:    Screening colonoscopy     HPI: Angela Kim is a 63 y.o. female undergoing average risk screening colonoscopy.  She has no family history of colon cancer and no chronic GI symptoms. She had a colonoscopy in 2012 which was notable for left sided diverticulosis, otherwise normal.   Past Medical History:  Diagnosis Date   ADHD    Coronary artery calcification 09/07/2023   Essential hypertension    Hemorrhoids    external   Precordial chest pain 09/07/2023   Pure hypercholesterolemia 09/07/2023    Past Surgical History:  Procedure Laterality Date   ABDOMINAL HYSTERECTOMY     WISDOM TOOTH EXTRACTION      Prior to Admission medications   Medication Sig Start Date End Date Taking? Authorizing Provider  ARMOUR THYROID 30 MG tablet Take 30 mg by mouth daily before breakfast.   Yes [provider]  hydrocortisone 2.5 % ointment Apply 1 Application topically as directed. 08/27/23  Yes [provider]  Magnesium 500 MG CAPS Take 500 mg by mouth at bedtime.   Yes [provider]  metroNIDAZOLE (METROCREAM) 0.75 % cream Apply 1 Application topically 2 (two) times daily. 08/27/23  Yes [provider]  valsartan (DIOVAN) 160 MG tablet Take 1 tablet (160 mg total) by mouth daily. D/C MAXZIDE 09/01/23  Yes Chilton Si, MD  ketoconazole (NIZORAL) 2 % cream Apply 1 Application topically as directed.    [provider]  rosuvastatin (CRESTOR) 20 MG tablet Take 1 tablet (20 mg total) by mouth daily. 10/05/23 01/11/24  Chilton Si, MD  tirzepatide 5 MG/0.5ML injection vial Inject 5 mg into the skin once a week. Dx code E66.0 11/09/23   Chilton Si, MD  zolpidem (AMBIEN) 5 MG tablet Take 5 mg by mouth at bedtime as needed for sleep. Patient not taking: Reported on 01/11/2024    [provider]    Current Outpatient Medications  Medication Sig Dispense Refill   ARMOUR THYROID 30 MG tablet Take 30 mg by mouth daily before breakfast.     hydrocortisone 2.5 % ointment Apply 1 Application topically as directed.     Magnesium 500 MG CAPS Take 500 mg by mouth at bedtime.     metroNIDAZOLE (METROCREAM) 0.75 % cream Apply 1 Application topically 2 (two) times daily.     valsartan (DIOVAN) 160 MG tablet Take 1 tablet (160 mg total) by mouth daily. D/C MAXZIDE 90 tablet 3   ketoconazole (NIZORAL) 2 % cream Apply 1 Application topically as directed.     rosuvastatin (CRESTOR) 20 MG tablet Take 1 tablet (20 mg total) by mouth daily. 90 tablet 3   tirzepatide 5 MG/0.5ML injection vial Inject 5 mg into the skin once a week. Dx code E66.0 2 mL 3   zolpidem (AMBIEN) 5 MG tablet Take 5 mg by mouth at bedtime as needed for sleep. (Patient not taking: Reported on 01/11/2024)     Current Facility-Administered Medications  Medication Dose Route Frequency Provider Last Rate Last Admin   0.9 %  sodium chloride infusion  500 mL Intravenous Once Jenel Lucks, MD        Allergies as of 01/21/2024   (No Known Allergies)    Family History  Problem Relation Age of Onset   Ovarian cancer Mother    Atrial fibrillation Father  Other Father        pacemaker   Colon cancer Neg Hx    Rectal cancer Neg Hx    Stomach cancer Neg Hx     Social History   Socioeconomic History   Marital status: Married    Spouse name: Not on file   Number of children: 4   Years of education: Not on file   Highest education level: Not on file  Occupational History   Occupation: authur/ speaker  Tobacco Use   Smoking status: Never   Smokeless tobacco: Never  Substance and Sexual Activity   Alcohol use: Yes    Alcohol/week: 2.0 standard drinks of alcohol    Types: 2 Glasses of wine per week    Comment: socially 1-2 a week   Drug use: No   Sexual activity: Yes  Other Topics Concern   Not  on file  Social History Narrative   She indicates that she does not actually exercising regularly, but does stay active.   Social Drivers of Corporate investment banker Strain: Not on file  Food Insecurity: Not on file  Transportation Needs: Not on file  Physical Activity: Not on file  Stress: Not on file  Social Connections: Not on file  Intimate Partner Violence: Not on file    Review of Systems:  All other review of systems negative except as mentioned in the HPI.  Physical Exam: Vital signs BP 109/73   Pulse 66   Temp (!) 96.4 F (35.8 C)   Resp (!) 9   Ht 5\' 6"  (1.676 m)   Wt 160 lb (72.6 kg)   LMP 07/28/2011   SpO2 100%   BMI 25.82 kg/m   General:   Alert,  Well-developed, well-nourished, pleasant and cooperative in NAD Airway:  Mallampati 1 Lungs:  Clear throughout to auscultation.   Heart:  Regular rate and rhythm; no murmurs, clicks, rubs,  or gallops. Abdomen:  Soft, nontender and nondistended. Normal bowel sounds.   Neuro/Psych:  Normal mood and affect. A and O x 3   Aino Heckert E. Tomasa Rand, MD [facility]

## 2024-01-22 ENCOUNTER — Telehealth: Payer: Self-pay

## 2024-01-22 ENCOUNTER — Ambulatory Visit (HOSPITAL_BASED_OUTPATIENT_CLINIC_OR_DEPARTMENT_OTHER): Payer: BC Managed Care – PPO | Admitting: Family

## 2024-01-22 NOTE — Telephone Encounter (Signed)
 Left message on follow up call.

## 2024-02-16 DIAGNOSIS — Z79899 Other long term (current) drug therapy: Secondary | ICD-10-CM | POA: Diagnosis not present

## 2024-02-16 DIAGNOSIS — E7800 Pure hypercholesterolemia, unspecified: Secondary | ICD-10-CM | POA: Diagnosis not present

## 2024-02-17 LAB — COMPREHENSIVE METABOLIC PANEL WITH GFR
ALT: 19 IU/L (ref 0–32)
AST: 20 IU/L (ref 0–40)
Albumin: 4.5 g/dL (ref 3.9–4.9)
Alkaline Phosphatase: 89 IU/L (ref 44–121)
BUN/Creatinine Ratio: 15 (ref 12–28)
BUN: 12 mg/dL (ref 8–27)
Bilirubin Total: 0.4 mg/dL (ref 0.0–1.2)
CO2: 23 mmol/L (ref 20–29)
Calcium: 10 mg/dL (ref 8.7–10.3)
Chloride: 102 mmol/L (ref 96–106)
Creatinine, Ser: 0.79 mg/dL (ref 0.57–1.00)
Globulin, Total: 2.2 g/dL (ref 1.5–4.5)
Glucose: 85 mg/dL (ref 70–99)
Potassium: 4.2 mmol/L (ref 3.5–5.2)
Sodium: 140 mmol/L (ref 134–144)
Total Protein: 6.7 g/dL (ref 6.0–8.5)
eGFR: 85 mL/min/{1.73_m2} (ref >59)

## 2024-02-17 LAB — LIPID PANEL
Chol/HDL Ratio: 2.4 ratio (ref 0.0–4.4)
Cholesterol, Total: 150 mg/dL (ref 100–199)
HDL: 62 mg/dL (ref >39)
LDL Chol Calc (NIH): 75 mg/dL (ref 0–99)
Triglycerides: 62 mg/dL (ref 0–149)
VLDL Cholesterol Cal: 13 mg/dL (ref 5–40)

## 2024-02-19 ENCOUNTER — Ambulatory Visit (HOSPITAL_BASED_OUTPATIENT_CLINIC_OR_DEPARTMENT_OTHER): Payer: BC Managed Care – PPO | Admitting: Family

## 2024-02-19 VITALS — BP 102/70 | HR 102 | Ht 66.0 in | Wt 159.0 lb

## 2024-02-19 DIAGNOSIS — E785 Hyperlipidemia, unspecified: Secondary | ICD-10-CM

## 2024-02-19 DIAGNOSIS — R5383 Other fatigue: Secondary | ICD-10-CM

## 2024-02-19 DIAGNOSIS — I25118 Atherosclerotic heart disease of native coronary artery with other forms of angina pectoris: Secondary | ICD-10-CM

## 2024-02-19 DIAGNOSIS — E538 Deficiency of other specified B group vitamins: Secondary | ICD-10-CM | POA: Diagnosis not present

## 2024-02-19 DIAGNOSIS — I10 Essential (primary) hypertension: Secondary | ICD-10-CM | POA: Diagnosis not present

## 2024-02-19 MED ORDER — VALSARTAN 160 MG PO TABS: 80.0000 mg | ORAL_TABLET | Freq: Every day | ORAL | Status: DC

## 2024-02-19 NOTE — Patient Instructions (Signed)
 Medication Instructions:  Your physician has recommended you make the following change in your medication:   Change Valsartan 80mg  (half tablet) daily  *If you need a refill on your cardiac medications before your next appointment, please call your pharmacy*  Lab Work: Labs today or in the next week or so   Follow-Up: At [facility], you and your health needs are our priority.  As part of our continuing mission to provide you with exceptional heart care, our providers are all part of one team.  This team includes your primary Cardiologist (physician) and Advanced Practice Providers or APPs (Physician Assistants and Nurse Practitioners) who all work together to provide you with the care you need, when you need it.  Your next appointment:   4 months with Dr. Duke Salvia, Gillian Shields, NP or Eligha Bridegroom, NP   Other Instructions We will send you a message in one week to check on BP

## 2024-02-19 NOTE — Progress Notes (Signed)
 Cardiology Office Note:  .   Date:  02/19/2024  ID:  Angela Kim, DOB 1960/12/11, MRN [id number] PCP: Merri Brunette, MD  [facility] Providers Cardiologist:  None    History of Present Illness: .   Angela Kim is a 63 y.o. female with hx of HTN, nonobstructive CAD, HLD, hypothyroidism.   08/2023 cardiac CTA with minimal CAD <25% stenosis of LAD and LCx with calcium score of 246 placing her in 93rd percentile and inadvertent finding of small PFO with left to right shunt.  Presents today for follow up. Successful weight loss on tirzepatide though notes feeling sluggish overall and unsure why. No longer on estrogen due to cardiovascular effects nor Adderal. Reports trying to come off of ambien and subsequent difficulty with sleep. Was taking a time release melatonin but was still waking up. She has held for 2 days and does not feel different. OBGYN recently checked vitamin D which was low normal 53 and TSH normal at 0.7. [place] thyroid managed by [employer]. No chest pain, dyspnea, edema, orthopnea, PND.    ROS: Please see the history of present illness.    All other systems reviewed and are negative.   Studies Reviewed: .        Cardiac Studies & Procedures   ______________________________________________________________________________________________          CT SCANS  CT CORONARY MORPH W/CTA COR W/SCORE 09/16/2023  Addendum 10/09/2023  2:44 AM ADDENDUM REPORT: 10/09/2023 02:41  EXAM: OVER-READ INTERPRETATION  CT CHEST  The following report is an over-read performed by radiologist Dr. Alcide Clever of [facility] on 10/09/2023. This over-read does not include interpretation of cardiac or coronary anatomy or pathology. The coronary calcium score/coronary CTA interpretation by the cardiologist is attached.  COMPARISON:  None.  FINDINGS: Cardiovascular: There are no significant extracardiac vascular findings.  Mediastinum/Nodes: There are no  enlarged lymph nodes within the visualized mediastinum.  Lungs/Pleura: There is no pleural effusion. The visualized lungs appear clear.  Upper abdomen: No significant findings in the visualized upper abdomen.  Musculoskeletal/Chest wall: No chest wall mass or suspicious osseous findings within the visualized chest.  IMPRESSION: No significant extracardiac findings within the visualized chest.   Electronically Signed By: Alcide Clever M.D. On: 10/09/2023 02:41  Narrative HISTORY: Chest pain/anginal equiv, intermediate CAD risk, treadmill candidate  EXAM: Cardiac/Coronary  CT  TECHNIQUE: The patient was scanned on a [employer].  PROTOCOL: A 120 kV prospective scan was triggered in the descending thoracic aorta at 111 HU's. Axial non-contrast 3 mm slices were carried out through the heart. The data set was analyzed on a dedicated work station and scored using the Agatston method. Gantry rotation speed was 250 msecs and collimation was .6 mm. Beta blockade and 0.8 mg of sl NTG was given. The 3D data set was reconstructed in 5% intervals of the 35-75 % of the R-R cycle. Systolic and diastolic phases were analyzed on a dedicated work station using MPR, MIP and VRT modes. The patient received contrast: OMNIPAQUE IOHEXOL 350 MG/ML SOLN.  FINDINGS: Image quality: Good  Noise artifact is: Limited  Coronary calcium score is 246, which places the patient in the 93rd percentile for age and sex matched control.  Coronary arteries: Normal coronary origins.  Right dominance.  Right Coronary Artery: No detectable plaque or stenosis.  Left Main Coronary Artery: No detectable plaque or stenosis.  Left Anterior Descending Coronary Artery: Minimal plaque proximal and mid LAD, <25% stenosis.  Left Circumflex Artery:  Minimal mixed plaque proximal LCx, <25% stenosis.  Aorta: Normal size, 33 mm at the mid ascending aorta (level of the PA bifurcation) measured  double oblique.  Aortic Valve: No calcifications.  Other findings:  Normal pulmonary vein drainage into the left atrium.  Normal left atrial appendage without thrombus.  Normal size of the pulmonary artery.  Small PFO with left to right shunt.  Please see separate report from [facility] for non-cardiac findings.  IMPRESSION: 1.  Minimal CAD, <25% stenosis, CADRADS 1.  2. Total plaque volume 230 mm3 which is 66th percentile for age- and sex-matched controls (calcified plaque 45 mm3; non-calcified plaque 185 mm3). TPV is moderate.  3. Coronary calcium score is 246, which places the patient in the 93rd percentile for age and sex matched control.  4. Normal coronary origins with right dominance.  5. Small patent foramen ovale with left to right shunt.  RECOMMENDATIONS: CAD-RADS 1. Minimal non-obstructive CAD (0-24%). Consider non-atherosclerotic causes of chest pain. Consider preventive therapy and risk factor modification.  Electronically Signed: By: Weston Brass M.D. On: 09/16/2023 15:52   CT SCANS  CT CARDIAC SCORING (SELF PAY ONLY) 07/30/2023  Addendum 08/14/2023 10:09 AM ADDENDUM REPORT: 08/14/2023 10:07  EXAM: OVER-READ INTERPRETATION CT CHEST  The following report is an over-read performed by radiologist Dr. Romona Curls of [facility] on 08/14/2023. This over-read does not include interpretation of cardiac or coronary anatomy or pathology. The coronary calcium score interpretation by the cardiologist is attached.  COMPARISON:  Chest radiograph dated 07/31/2023.  FINDINGS: Cardiovascular: Vascular calcifications are seen in the thoracic aorta. Normal heart size. No pericardial effusion.  Mediastinum/Nodes: No enlarged mediastinal lymph nodes. The visible trachea and esophagus demonstrate no significant findings.  Lungs/Pleura: The visible lungs are clear. No pleural effusion.  Upper Abdomen: No acute  abnormality.  Musculoskeletal: No chest wall mass or suspicious bone lesions identified.  IMPRESSION: 1. No acute findings in the visible chest.  Aortic Atherosclerosis (ICD10-I70.0).   Electronically Signed By: Romona Curls M.D. On: 08/14/2023 10:07  Narrative CLINICAL DATA:  Cardiovascular Disease Risk stratification  EXAM: Coronary Calcium Score  TECHNIQUE: A gated, non-contrast computed tomography scan of the heart was performed using 3mm slice thickness. Axial images were analyzed on a dedicated workstation. Calcium scoring of the coronary arteries was performed using the Agatston method.  MEDICATIONS: MEDICATIONS None  FINDINGS: Coronary arteries: Normal origins.  Coronary Calcium Score:  Left main: 0  Left anterior descending artery: 53.1  Left circumflex artery: 255  Right coronary artery: 0  Total: 308  Percentile: 95th  Pericardium: Normal.  Ascending Aorta: Normal caliber.  Non-cardiac: See separate report from [facility].  IMPRESSION: Coronary calcium score of 308 Agatston units. This was 95th percentile for age-, race-, and sex-matched controls.  RECOMMENDATIONS: Coronary artery calcium (CAC) score is a strong predictor of incident coronary heart disease (CHD) and provides predictive information beyond traditional risk factors. CAC scoring is reasonable to use in the decision to withhold, postpone, or initiate statin therapy in intermediate-risk or selected borderline-risk asymptomatic adults (age 52-75 years and LDL-C >=70 to <190 mg/dL) who do not have diabetes or established atherosclerotic cardiovascular disease (ASCVD).* In intermediate-risk (10-year ASCVD risk >=7.5% to <20%) adults or selected borderline-risk (10-year ASCVD risk >=5% to <7.5%) adults in whom a CAC score is measured for the purpose of making a treatment decision the following recommendations have been made:  If CAC=0, it is reasonable to withhold  statin therapy and reassess in 5 to 10  years, as long as higher risk conditions are absent (diabetes mellitus, family history of premature CHD in first degree relatives (males <55 years; females <65 years), cigarette smoking, or LDL >=190 mg/dL).  If CAC is 1 to 99, it is reasonable to initiate statin therapy for patients >=59 years of age.  If CAC is >=100 or >=75th percentile, it is reasonable to initiate statin therapy at any age.  Cardiology referral should be considered for patients with CAC scores >=400 or >=75th percentile.  *2018 AHA/ACC/AACVPR/AAPA/ABC/ACPM/ADA/AGS/APhA/ASPC/NLA/PCNA Guideline on the Management of Blood Cholesterol: A Report of the American College of Cardiology/American Heart Association Task Force on Clinical Practice Guidelines. J Am Coll Cardiol. 2019;73(24):3168-3209.  Marca Ancona, MD  Electronically Signed: By: Marca Ancona M.D. On: 07/30/2023 22:48     ______________________________________________________________________________________________      Risk Assessment/Calculations:             Physical Exam:   VS:  BP 102/70 (BP Location: Left Arm)   Pulse (!) 102   Ht 5\' 6"  (1.676 m)   Wt 159 lb (72.1 kg)   LMP 07/28/2011   SpO2 98%   BMI 25.66 kg/m    Wt Readings from Last 3 Encounters:  02/19/24 159 lb (72.1 kg)  01/21/24 160 lb (72.6 kg)  01/11/24 160 lb (72.6 kg)    GEN: Well nourished, well developed in no acute distress NECK: No JVD; No carotid bruits CARDIAC: RRR, no murmurs, rubs, gallops RESPIRATORY:  Clear to auscultation without rales, wheezing or rhonchi  ABDOMEN: Soft, non-tender, non-distended EXTREMITIES:  No edema; No deformity   ASSESSMENT AND PLAN: .    CAD / HLD, LDL goal <70 - Stable with no anginal symptoms. No indication for ischemic evaluation.  GDMT Rosuvastatin 20mg  daily. Recommend aiming for 150 minutes of moderate intensity activity per week and following a heart healthy diet.   HTN - BP  relatively hypotensive likely due to weight loss. Concern could contribute to fatigue.  Reduce valsartan to 80mg  daily Discussed to monitor BP at home at least 2 hours after medications and sitting for 5-10 minutes.  MyChart in 1 week to check in on BP Fatigue - Recent TSH normal and vitamin D low normal, she will follow up with her OBGYN if any changes needed. Reduce Valsartan, as above. Labs today CBC, iron, B12 to assess for another etiology of her fatigue. Discussed safe to use Ambien as prescribed to facilitate sleep as lack of sleep also likely contributory to fatigue.  Hypothyroidism - recent TSH unremarkable. Follows with [facility].        Dispo: follow up in 4 mos  Signed, Alver Sorrow, NP

## 2024-02-20 LAB — IRON,TIBC AND FERRITIN PANEL
Ferritin: 301 ng/mL — ABNORMAL HIGH (ref 15–150)
Iron Saturation: 48 % (ref 15–55)
Iron: 116 ug/dL (ref 27–139)
Total Iron Binding Capacity: 243 ug/dL — ABNORMAL LOW (ref 250–450)
UIBC: 127 ug/dL (ref 118–369)

## 2024-02-20 LAB — CBC
Hematocrit: 42 % (ref 34.0–46.6)
Hemoglobin: 13.7 g/dL (ref 11.1–15.9)
MCH: 30.1 pg (ref 26.6–33.0)
MCHC: 32.6 g/dL (ref 31.5–35.7)
MCV: 92 fL (ref 79–97)
Platelets: 294 10*3/uL (ref 150–450)
RBC: 4.55 x10E6/uL (ref 3.77–5.28)
RDW: 12.3 % (ref 11.7–15.4)
WBC: 5.3 10*3/uL (ref 3.4–10.8)

## 2024-02-20 LAB — B12 AND FOLATE PANEL
Folate: 4.6 ng/mL (ref >3.0)
Vitamin B-12: >2000 pg/mL — ABNORMAL HIGH (ref 232–1245)

## 2024-02-21 ENCOUNTER — Encounter (HOSPITAL_BASED_OUTPATIENT_CLINIC_OR_DEPARTMENT_OTHER): Payer: Self-pay

## 2024-02-21 ENCOUNTER — Encounter (HOSPITAL_BASED_OUTPATIENT_CLINIC_OR_DEPARTMENT_OTHER): Payer: Self-pay | Admitting: Family

## 2024-02-25 ENCOUNTER — Other Ambulatory Visit: Payer: Self-pay | Admitting: Cardiovascular Disease

## 2024-02-25 DIAGNOSIS — K130 Diseases of lips: Secondary | ICD-10-CM | POA: Diagnosis not present

## 2024-02-25 DIAGNOSIS — H019 Unspecified inflammation of eyelid: Secondary | ICD-10-CM | POA: Diagnosis not present

## 2024-02-26 ENCOUNTER — Telehealth (HOSPITAL_BASED_OUTPATIENT_CLINIC_OR_DEPARTMENT_OTHER): Payer: Self-pay

## 2024-02-26 NOTE — Telephone Encounter (Signed)
-----   Message from Nurse Lendon Collar sent at 02/19/2024 12:02 PM EDT ----- BP check in

## 2024-02-26 NOTE — Addendum Note (Signed)
 Addended by: Alver Sorrow on: 02/26/2024 11:10 AM   Modules accepted: Orders

## 2024-03-02 ENCOUNTER — Encounter (HOSPITAL_BASED_OUTPATIENT_CLINIC_OR_DEPARTMENT_OTHER): Payer: Self-pay | Admitting: Cardiovascular Disease

## 2024-03-10 DIAGNOSIS — H18413 Arcus senilis, bilateral: Secondary | ICD-10-CM | POA: Diagnosis not present

## 2024-03-10 DIAGNOSIS — H2511 Age-related nuclear cataract, right eye: Secondary | ICD-10-CM | POA: Diagnosis not present

## 2024-03-10 DIAGNOSIS — H25043 Posterior subcapsular polar age-related cataract, bilateral: Secondary | ICD-10-CM | POA: Diagnosis not present

## 2024-03-10 DIAGNOSIS — H25013 Cortical age-related cataract, bilateral: Secondary | ICD-10-CM | POA: Diagnosis not present

## 2024-03-10 DIAGNOSIS — H2513 Age-related nuclear cataract, bilateral: Secondary | ICD-10-CM | POA: Diagnosis not present

## 2024-04-20 DIAGNOSIS — H2512 Age-related nuclear cataract, left eye: Secondary | ICD-10-CM | POA: Diagnosis not present

## 2024-06-14 DIAGNOSIS — H25011 Cortical age-related cataract, right eye: Secondary | ICD-10-CM | POA: Diagnosis not present

## 2024-06-14 DIAGNOSIS — E039 Hypothyroidism, unspecified: Secondary | ICD-10-CM | POA: Diagnosis not present

## 2024-06-14 DIAGNOSIS — H25041 Posterior subcapsular polar age-related cataract, right eye: Secondary | ICD-10-CM | POA: Diagnosis not present

## 2024-06-14 DIAGNOSIS — H268 Other specified cataract: Secondary | ICD-10-CM | POA: Diagnosis not present

## 2024-06-14 DIAGNOSIS — H2512 Age-related nuclear cataract, left eye: Secondary | ICD-10-CM | POA: Diagnosis not present

## 2024-06-14 DIAGNOSIS — H2511 Age-related nuclear cataract, right eye: Secondary | ICD-10-CM | POA: Diagnosis not present

## 2024-06-20 ENCOUNTER — Ambulatory Visit (HOSPITAL_BASED_OUTPATIENT_CLINIC_OR_DEPARTMENT_OTHER): Admitting: Family

## 2024-06-20 ENCOUNTER — Encounter (HOSPITAL_BASED_OUTPATIENT_CLINIC_OR_DEPARTMENT_OTHER): Payer: Self-pay | Admitting: Family

## 2024-06-20 DIAGNOSIS — Z6824 Body mass index (BMI) 24.0-24.9, adult: Secondary | ICD-10-CM

## 2024-06-20 DIAGNOSIS — I10 Essential (primary) hypertension: Secondary | ICD-10-CM | POA: Diagnosis not present

## 2024-06-20 DIAGNOSIS — I25118 Atherosclerotic heart disease of native coronary artery with other forms of angina pectoris: Secondary | ICD-10-CM | POA: Diagnosis not present

## 2024-06-20 DIAGNOSIS — E785 Hyperlipidemia, unspecified: Secondary | ICD-10-CM | POA: Diagnosis not present

## 2024-06-20 MED ORDER — ROSUVASTATIN CALCIUM 20 MG PO TABS: 20.0000 mg | ORAL_TABLET | Freq: Every day | ORAL | 3 refills | Status: AC

## 2024-06-20 MED ORDER — TIRZEPATIDE-WEIGHT MANAGEMENT 7.5 MG/0.5ML ~~LOC~~ SOLN: 7.5000 mg | SUBCUTANEOUS | 1 refills | Status: DC

## 2024-06-20 NOTE — Patient Instructions (Signed)
 Medication Instructions:   CHANGE Zepbound  to 7.5mg  weekly   *If you need a refill on your cardiac medications before your next appointment, please call your pharmacy*  Follow-Up: At [facility], you and your health needs are our priority.  As part of our continuing mission to provide you with exceptional heart care, our providers are all part of one team.  This team includes your primary Cardiologist (physician) and Advanced Practice Providers or APPs (Physician Assistants and Nurse Practitioners) who all work together to provide you with the care you need, when you need it.  Your next appointment:   4-6 month(s)  Provider:   Annabella Scarce, MD    We recommend signing up for the patient portal called MyChart.  Sign up information is provided on this After Visit Summary.  MyChart is used to connect with patients for Virtual Visits (Telemedicine).  Patients are able to view lab/test results, encounter notes, upcoming appointments, etc.  Non-urgent messages can be sent to your provider as well.   To learn more about what you can do with MyChart, go to [email or web address].   Other Instructions Heart Healthy Diet Recommendations: A low-salt diet is recommended. Meats should be grilled, baked, or boiled. Avoid fried foods. Focus on lean protein sources like fish or chicken with vegetables and fruits. The American Heart Association is a Chief Technology Officer!  American Heart Association Diet and Lifeystyle Recommendations   Exercise recommendations: The American Heart Association recommends 150 minutes of moderate intensity exercise weekly. Try 30 minutes of moderate intensity exercise 4-5 times per week. This could include walking, jogging, or swimming.

## 2024-06-20 NOTE — Progress Notes (Unsigned)
 Cardiology Office Note:  .   Date:  06/21/2024  ID:  Angela Kim, DOB 29-Dec-1960, MRN [id number] PCP: Hives Nottingham, MD  [facility] Providers Cardiologist:  None    History of Present Illness: .   Angela Kim is a 63 y.o. female with hx of HTN, nonobstructive CAD, HLD, hypothyroidism.   08/2023 cardiac CTA with minimal CAD <25% stenosis of LAD and LCx with calcium  score of 246 placing her in 93rd percentile and inadvertent finding of small PFO with left to right shunt.  She was last seen 02/19/2024.  She had had successful weight loss on Zepbound  with subsequent hypotension.  Valsartan  was reduced to 80 mg daily and later discontinued.  Presents today for follow-up independently.  Reports feeling overall well since last seen.  Her blood pressure has been well-controlled and she has not had recurrent hypotension.  Continues to slowly but steadily lose weight on Zepbound .  Of note she is only on the 5 mg dose. Reports energy levels improved from previous.   ROS: Please see the history of present illness.    All other systems reviewed and are negative.   Studies Reviewed: .        Cardiac Studies & Procedures   ______________________________________________________________________________________________          CT SCANS  CT CORONARY MORPH W/CTA COR W/SCORE 09/16/2023  Addendum 10/09/2023  2:44 AM ADDENDUM REPORT: 10/09/2023 02:41  EXAM: OVER-READ INTERPRETATION  CT CHEST  The following report is an over-read performed by radiologist Dr. Oneil Devonshire of [facility] on 10/09/2023. This over-read does not include interpretation of cardiac or coronary anatomy or pathology. The coronary calcium  score/coronary CTA interpretation by the cardiologist is attached.  COMPARISON:  None.  FINDINGS: Cardiovascular: There are no significant extracardiac vascular findings.  Mediastinum/Nodes: There are no enlarged lymph nodes within the visualized  mediastinum.  Lungs/Pleura: There is no pleural effusion. The visualized lungs appear clear.  Upper abdomen: No significant findings in the visualized upper abdomen.  Musculoskeletal/Chest wall: No chest wall mass or suspicious osseous findings within the visualized chest.  IMPRESSION: No significant extracardiac findings within the visualized chest.   Electronically Signed By: Oneil Devonshire M.D. On: 10/09/2023 02:41  Narrative HISTORY: Chest pain/anginal equiv, intermediate CAD risk, treadmill candidate  EXAM: Cardiac/Coronary  CT  TECHNIQUE: The patient was scanned on a [employer].  PROTOCOL: A 120 kV prospective scan was triggered in the descending thoracic aorta at 111 HU's. Axial non-contrast 3 mm slices were carried out through the heart. The data set was analyzed on a dedicated work station and scored using the Agatston method. Gantry rotation speed was 250 msecs and collimation was .6 mm. Beta blockade and 0.8 mg of sl NTG was given. The 3D data set was reconstructed in 5% intervals of the 35-75 % of the R-R cycle. Systolic and diastolic phases were analyzed on a dedicated work station using MPR, MIP and VRT modes. The patient received contrast: 100mL OMNIPAQUE  IOHEXOL  350 MG/ML SOLN.  FINDINGS: Image quality: Good  Noise artifact is: Limited  Coronary calcium  score is 246, which places the patient in the 93rd percentile for age and sex matched control.  Coronary arteries: Normal coronary origins.  Right dominance.  Right Coronary Artery: No detectable plaque or stenosis.  Left Main Coronary Artery: No detectable plaque or stenosis.  Left Anterior Descending Coronary Artery: Minimal plaque proximal and mid LAD, <25% stenosis.  Left Circumflex Artery: Minimal mixed plaque proximal LCx, <25% stenosis.  Aorta: Normal size, 33 mm at the mid ascending aorta (level of the PA bifurcation) measured double oblique.  Aortic Valve: No  calcifications.  Other findings:  Normal pulmonary vein drainage into the left atrium.  Normal left atrial appendage without thrombus.  Normal size of the pulmonary artery.  Small PFO with left to right shunt.  Please see separate report from [facility] for non-cardiac findings.  IMPRESSION: 1.  Minimal CAD, <25% stenosis, CADRADS 1.  2. Total plaque volume 230 mm3 which is 66th percentile for age- and sex-matched controls (calcified plaque 45 mm3; non-calcified plaque 185 mm3). TPV is moderate.  3. Coronary calcium  score is 246, which places the patient in the 93rd percentile for age and sex matched control.  4. Normal coronary origins with right dominance.  5. Small patent foramen ovale with left to right shunt.  RECOMMENDATIONS: CAD-RADS 1. Minimal non-obstructive CAD (0-24%). Consider non-atherosclerotic causes of chest pain. Consider preventive therapy and risk factor modification.  Electronically Signed: By: Soyla Merck M.D. On: 09/16/2023 15:52   CT SCANS  CT CARDIAC SCORING (SELF PAY ONLY) 07/30/2023  Addendum 08/14/2023 10:09 AM ADDENDUM REPORT: 08/14/2023 10:07  EXAM: OVER-READ INTERPRETATION CT CHEST  The following report is an over-read performed by radiologist Dr. Norman Hopper of [facility] on 08/14/2023. This over-read does not include interpretation of cardiac or coronary anatomy or pathology. The coronary calcium  score interpretation by the cardiologist is attached.  COMPARISON:  Chest radiograph dated 07/31/2023.  FINDINGS: Cardiovascular: Vascular calcifications are seen in the thoracic aorta. Normal heart size. No pericardial effusion.  Mediastinum/Nodes: No enlarged mediastinal lymph nodes. The visible trachea and esophagus demonstrate no significant findings.  Lungs/Pleura: The visible lungs are clear. No pleural effusion.  Upper Abdomen: No acute abnormality.  Musculoskeletal: No chest wall mass or  suspicious bone lesions identified.  IMPRESSION: 1. No acute findings in the visible chest.  Aortic Atherosclerosis (ICD10-I70.0).   Electronically Signed By: Norman Hopper M.D. On: 08/14/2023 10:07  Narrative CLINICAL DATA:  Cardiovascular Disease Risk stratification  EXAM: Coronary Calcium  Score  TECHNIQUE: A gated, non-contrast computed tomography scan of the heart was performed using 3mm slice thickness. Axial images were analyzed on a dedicated workstation. Calcium  scoring of the coronary arteries was performed using the Agatston method.  MEDICATIONS: MEDICATIONS None  FINDINGS: Coronary arteries: Normal origins.  Coronary Calcium  Score:  Left main: 0  Left anterior descending artery: 53.1  Left circumflex artery: 255  Right coronary artery: 0  Total: 308  Percentile: 95th  Pericardium: Normal.  Ascending Aorta: Normal caliber.  Non-cardiac: See separate report from [facility].  IMPRESSION: Coronary calcium  score of 308 Agatston units. This was 95th percentile for age-, race-, and sex-matched controls.  RECOMMENDATIONS: Coronary artery calcium  (CAC) score is a strong predictor of incident coronary heart disease (CHD) and provides predictive information beyond traditional risk factors. CAC scoring is reasonable to use in the decision to withhold, postpone, or initiate statin therapy in intermediate-risk or selected borderline-risk asymptomatic adults (age 26-75 years and LDL-C >=70 to <190 mg/dL) who do not have diabetes or established atherosclerotic cardiovascular disease (ASCVD).* In intermediate-risk (10-year ASCVD risk >=7.5% to <20%) adults or selected borderline-risk (10-year ASCVD risk >=5% to <7.5%) adults in whom a CAC score is measured for the purpose of making a treatment decision the following recommendations have been made:  If CAC=0, it is reasonable to withhold statin therapy and reassess in 5 to 10 years, as long as  higher risk conditions are  absent (diabetes mellitus, family history of premature CHD in first degree relatives (males <55 years; females <65 years), cigarette smoking, or LDL >=190 mg/dL).  If CAC is 1 to 99, it is reasonable to initiate statin therapy for patients >=82 years of age.  If CAC is >=100 or >=75th percentile, it is reasonable to initiate statin therapy at any age.  Cardiology referral should be considered for patients with CAC scores >=400 or >=75th percentile.  *2018 AHA/ACC/AACVPR/AAPA/ABC/ACPM/ADA/AGS/APhA/ASPC/NLA/PCNA Guideline on the Management of Blood Cholesterol: A Report of the American College of Cardiology/American Heart Association Task Force on Clinical Practice Guidelines. J Am Coll Cardiol. 2019;73(24):3168-3209.  Ezra Shuck, MD  Electronically Signed: By: Ezra Shuck M.D. On: 07/30/2023 22:48     ______________________________________________________________________________________________      Risk Assessment/Calculations:             Physical Exam:   VS:  BP 120/78 (BP Location: Left Arm, Patient Position: Sitting)   Pulse 73   Ht 5' 6 (1.676 m)   Wt 154 lb 7.2 oz (70.1 kg)   LMP 07/28/2011   SpO2 100%   BMI 24.93 kg/m    Wt Readings from Last 3 Encounters:  06/20/24 154 lb 7.2 oz (70.1 kg)  02/19/24 159 lb (72.1 kg)  01/21/24 160 lb (72.6 kg)    GEN: Well nourished, well developed in no acute distress NECK: No JVD; No carotid bruits CARDIAC: RRR, no murmurs, rubs, gallops RESPIRATORY:  Clear to auscultation without rales, wheezing or rhonchi  ABDOMEN: Soft, non-tender, non-distended EXTREMITIES:  No edema; No deformity   ASSESSMENT AND PLAN: .    CAD / HLD, LDL goal <70 - Stable with no anginal symptoms. No indication for ischemic evaluation.  GDMT Rosuvastatin  20mg  daily. Refill provided. Recommend aiming for 150 minutes of moderate intensity activity per week and following a heart healthy diet.    HTN - BP at goal  <130/80. No longer requiring antihypertensive agents after weight loss.   Obesity - successful weight loss with Zepbound . She is doing through self pay. Interested in further weight loss. Will increase from zebpound 5mg  to 7.5mg  weekly. Likely could start to titrate off at follow up as she achieves healthy BMI.   Hypothyroidism -  Follows with [facility].        Dispo: follow up in 4-6 mos  Signed, Reche GORMAN Finder, NP

## 2024-06-21 ENCOUNTER — Encounter (HOSPITAL_BASED_OUTPATIENT_CLINIC_OR_DEPARTMENT_OTHER): Payer: Self-pay | Admitting: Family

## 2024-06-21 DIAGNOSIS — H2511 Age-related nuclear cataract, right eye: Secondary | ICD-10-CM | POA: Diagnosis not present

## 2024-06-21 DIAGNOSIS — E039 Hypothyroidism, unspecified: Secondary | ICD-10-CM | POA: Diagnosis not present

## 2024-06-21 DIAGNOSIS — H268 Other specified cataract: Secondary | ICD-10-CM | POA: Diagnosis not present

## 2024-06-23 ENCOUNTER — Other Ambulatory Visit: Payer: Self-pay | Admitting: Cardiovascular Disease

## 2024-07-11 ENCOUNTER — Encounter (HOSPITAL_BASED_OUTPATIENT_CLINIC_OR_DEPARTMENT_OTHER): Payer: Self-pay

## 2024-08-10 ENCOUNTER — Other Ambulatory Visit: Payer: Self-pay | Admitting: Cardiovascular Disease

## 2024-09-09 ENCOUNTER — Other Ambulatory Visit: Payer: Self-pay | Admitting: Cardiovascular Disease

## 2024-09-09 NOTE — Telephone Encounter (Signed)
 Refill Request.

## 2024-10-12 ENCOUNTER — Encounter (HOSPITAL_BASED_OUTPATIENT_CLINIC_OR_DEPARTMENT_OTHER): Payer: Self-pay

## 2024-10-12 MED ORDER — ZEPBOUND 7.5 MG/0.5ML ~~LOC~~ SOLN: 7.5000 mg | SUBCUTANEOUS | 3 refills | Status: AC

## 2024-11-03 ENCOUNTER — Encounter (HOSPITAL_BASED_OUTPATIENT_CLINIC_OR_DEPARTMENT_OTHER): Payer: Self-pay | Admitting: Cardiovascular Disease

## 2024-11-03 ENCOUNTER — Ambulatory Visit (HOSPITAL_BASED_OUTPATIENT_CLINIC_OR_DEPARTMENT_OTHER): Admitting: Cardiovascular Disease

## 2024-11-03 VITALS — BP 122/84 | HR 74 | Ht 66.0 in | Wt 150.0 lb

## 2024-11-03 DIAGNOSIS — I25118 Atherosclerotic heart disease of native coronary artery with other forms of angina pectoris: Secondary | ICD-10-CM | POA: Diagnosis not present

## 2024-11-03 DIAGNOSIS — I10 Essential (primary) hypertension: Secondary | ICD-10-CM | POA: Diagnosis not present

## 2024-11-03 DIAGNOSIS — I2510 Atherosclerotic heart disease of native coronary artery without angina pectoris: Secondary | ICD-10-CM

## 2024-11-03 DIAGNOSIS — E7800 Pure hypercholesterolemia, unspecified: Secondary | ICD-10-CM

## 2024-11-03 DIAGNOSIS — E785 Hyperlipidemia, unspecified: Secondary | ICD-10-CM | POA: Diagnosis not present

## 2024-11-03 NOTE — Patient Instructions (Signed)
 Medication Instructions:   Your physician recommends that you continue on your current medications as directed. Please refer to the Current Medication list given to you today.   *If you need a refill on your cardiac medications before your next appointment, please call your pharmacy*  Lab Work:  None ordered.  If you have labs (blood work) drawn today and your tests are completely normal, you will receive your results only by: MyChart Message (if you have MyChart) OR A paper copy in the mail If you have any lab test that is abnormal or we need to change your treatment, we will call you to review the results.  Testing/Procedures:  None ordered.  Follow-Up: At [facility], you and your health needs are our priority.  As part of our continuing mission to provide you with exceptional heart care, our providers are all part of one team.  This team includes your primary Cardiologist (physician) and Advanced Practice Providers or APPs (Physician Assistants and Nurse Practitioners) who all work together to provide you with the care you need, when you need it.  Your next appointment:   As needed   Provider:   Annabella Scarce, MD    We recommend signing up for the patient portal called MyChart.  Sign up information is provided on this After Visit Summary.  MyChart is used to connect with patients for Virtual Visits (Telemedicine).  Patients are able to view lab/test results, encounter notes, upcoming appointments, etc.  Non-urgent messages can be sent to your provider as well.   To learn more about what you can do with MyChart, go to [email or web address].   Other Instructions  As needed.

## 2024-11-03 NOTE — Progress Notes (Signed)
 " Cardiology Office Note:  .   Date:  11/03/2024  ID:  Angela Kim, DOB 03/27/1961, MRN [id number] PCP: Angela Nottingham, MD  [facility] Providers Cardiologist:  Angela Kim    History of Present Illness: .   Angela Kim is a 63 y.o. female with hypertension and minimal non-obstructive CAD here for follow up.  She was seen in the ED 07/2023 with chest pain.  It was thought to be non-ischemic.  She was seen for concerns about a high cardiac calcium  score and intermittent chest pressure. The chest discomfort, described as pressure rather than pain, was located on the left side and was constant throughout the day it was most noticeable. Calcium  score was 308, which was 95th percentile.    Discussed the use of AI scribe software for clinical note transcription with the patient, who gave verbal consent to proceed.  History of Present Illness Angela Kim experienced significant weight loss over the past year, losing 20 pounds. She feels better and is motivated to continue losing weight, although she has not yet reached her goal weight. She is able to fasten her pants more easily and is pleased with her progress.  She maintains an active lifestyle, achieving her goal of 10,000 steps a day through daily activities rather than formal workouts.  No issues with breathing, chest pain, or pressure during physical activity. She is currently taking rosuvastatin  for cholesterol management.  Her diet has improved significantly, with her husband noting she has not eaten potato chips in months. She attributes her dietary changes to her weight loss success.  ROS:  As per HPI  Studies Reviewed: SABRA       Coronary CT-A 08/2023: IMPRESSION: 1.  Minimal CAD, <25% stenosis, CADRADS 1.   2. Total plaque volume 230 mm3 which is 66th percentile for age- and sex-matched controls (calcified plaque 45 mm3; non-calcified plaque 185 mm3). TPV is moderate.   3. Coronary calcium  score is 246, which places the patient  in the 93rd percentile for age and sex matched control.   4. Normal coronary origins with right dominance.   5. Small patent foramen ovale with left to right shunt.    Risk Assessment/Calculations:             Physical Exam:   VS:  BP 122/84 (BP Location: Left Arm, Patient Position: Sitting, Cuff Size: Normal)   Pulse 74   Ht 5' 6 (1.676 m)   Wt 150 lb (68 kg)   LMP 07/28/2011   SpO2 100%   BMI 24.21 kg/m  , BMI Body mass index is 24.21 kg/m. GENERAL:  Well appearing HEENT: Pupils equal round and reactive, fundi not visualized, oral mucosa unremarkable NECK:  No jugular venous distention, waveform within normal limits, carotid upstroke brisk and symmetric, no bruits, no thyromegaly LUNGS:  Clear to auscultation bilaterally HEART:  RRR.  PMI not displaced or sustained,S1 and S2 within normal limits, no S3, no S4, no clicks, no rubs, no murmurs ABD:  Flat, positive bowel sounds normal in frequency in pitch, no bruits, no rebound, no guarding, no midline pulsatile mass, no hepatomegaly, no splenomegaly EXT:  2 plus pulses throughout, no edema, no cyanosis no clubbing SKIN:  No rashes no nodules NEURO:  Cranial nerves II through XII grossly intact, motor grossly intact throughout PSYCH:  Cognitively intact, oriented to person place and time   ASSESSMENT AND PLAN: .    Assessment & Plan # Coronary artery disease:  Coronary artery disease well-controlled, minimal disease on  CT, no symptoms. Statins effective. - Continue rosuvastatin . - Follow up if symptoms occur.  # Hyperlipidemia Reasonably well-controlled, LDL at 75. - Continue current statin therapy.  She isn't interested in increasing the dose at this time.   # Primary hypertension Blood pressure well-controlled with lifestyle modifications. - Continue lifestyle modifications.   Dispo: f/u as needed  Signed, Angela Scarce, MD   "

## 2024-11-09 ENCOUNTER — Encounter (HOSPITAL_BASED_OUTPATIENT_CLINIC_OR_DEPARTMENT_OTHER): Payer: Self-pay | Admitting: Cardiovascular Disease
